# Patient Record
Sex: Female | Born: 1990 | Race: White | Hispanic: No | Marital: Single | Smoking: Never smoker
Health system: Southern US, Community
[De-identification: ages and names within clinical notes are randomized; demographics above are authoritative.]

## PROBLEM LIST (undated history)

## (undated) DIAGNOSIS — M4800 Spinal stenosis, site unspecified: Secondary | ICD-10-CM

## (undated) DIAGNOSIS — E119 Type 2 diabetes mellitus without complications: Secondary | ICD-10-CM

## (undated) DIAGNOSIS — E063 Autoimmune thyroiditis: Secondary | ICD-10-CM

---

## 2012-05-06 ENCOUNTER — Encounter (HOSPITAL_COMMUNITY): Payer: Self-pay | Admitting: Nurse Practitioner

## 2012-05-06 ENCOUNTER — Emergency Department (HOSPITAL_COMMUNITY)
Admission: EM | Admit: 2012-05-06 | Discharge: 2012-05-06 | Disposition: A | Discharge status: Home / Self Care | Payer: Federal, State, Local not specified - PPO | Attending: Emergency Medicine | Admitting: Emergency Medicine

## 2012-05-06 DIAGNOSIS — E1169 Type 2 diabetes mellitus with other specified complication: Secondary | ICD-10-CM | POA: Insufficient documentation

## 2012-05-06 DIAGNOSIS — R197 Diarrhea, unspecified: Secondary | ICD-10-CM | POA: Insufficient documentation

## 2012-05-06 DIAGNOSIS — R112 Nausea with vomiting, unspecified: Secondary | ICD-10-CM

## 2012-05-06 DIAGNOSIS — Z79899 Other long term (current) drug therapy: Secondary | ICD-10-CM | POA: Insufficient documentation

## 2012-05-06 DIAGNOSIS — R739 Hyperglycemia, unspecified: Secondary | ICD-10-CM

## 2012-05-06 DIAGNOSIS — E669 Obesity, unspecified: Secondary | ICD-10-CM | POA: Insufficient documentation

## 2012-05-06 HISTORY — DX: Type 2 diabetes mellitus without complications: E11.9

## 2012-05-06 LAB — CBC WITH DIFFERENTIAL/PLATELET
Basophils Absolute: 0 10*3/uL (ref 0.0–0.1)
Basophils Relative: 0 % (ref 0–1)
Eosinophils Absolute: 0 10*3/uL (ref 0.0–0.7)
Eosinophils Relative: 0 % (ref 0–5)
HCT: 42.8 % (ref 36.0–46.0)
Hemoglobin: 15.2 g/dL — ABNORMAL HIGH (ref 12.0–15.0)
Lymphocytes Relative: 5 % — ABNORMAL LOW (ref 12–46)
Lymphs Abs: 0.9 10*3/uL (ref 0.7–4.0)
MCH: 28.6 pg (ref 26.0–34.0)
MCHC: 35.5 g/dL (ref 30.0–36.0)
MCV: 80.6 fL (ref 78.0–100.0)
Monocytes Absolute: 0.5 10*3/uL (ref 0.1–1.0)
Monocytes Relative: 3 % (ref 3–12)
Neutro Abs: 14.9 10*3/uL — ABNORMAL HIGH (ref 1.7–7.7)
Neutrophils Relative %: 92 % — ABNORMAL HIGH (ref 43–77)
Platelets: 310 10*3/uL (ref 150–400)
RBC: 5.31 MIL/uL — ABNORMAL HIGH (ref 3.87–5.11)
RDW: 13.5 % (ref 11.5–15.5)
WBC: 16.2 10*3/uL — ABNORMAL HIGH (ref 4.0–10.5)

## 2012-05-06 LAB — URINALYSIS, MICROSCOPIC ONLY
Bilirubin Urine: NEGATIVE
Glucose, UA: >1000 mg/dL — AB
Hgb urine dipstick: NEGATIVE
Ketones, ur: >80 mg/dL — AB
Leukocytes, UA: NEGATIVE
Nitrite: NEGATIVE
Protein, ur: >300 mg/dL — AB
Specific Gravity, Urine: 1.041 — ABNORMAL HIGH (ref 1.005–1.030)
Urobilinogen, UA: 0.2 mg/dL (ref 0.0–1.0)
pH: 5 (ref 5.0–8.0)

## 2012-05-06 LAB — COMPREHENSIVE METABOLIC PANEL
ALT: 17 U/L (ref 0–35)
AST: 15 U/L (ref 0–37)
Albumin: 4.2 g/dL (ref 3.5–5.2)
Alkaline Phosphatase: 78 U/L (ref 39–117)
BUN: 15 mg/dL (ref 6–23)
CO2: 22 mEq/L (ref 19–32)
Calcium: 9.5 mg/dL (ref 8.4–10.5)
Chloride: 96 mEq/L (ref 96–112)
Creatinine, Ser: 0.56 mg/dL (ref 0.50–1.10)
GFR calc Af Amer: >90 mL/min (ref >90)
GFR calc non Af Amer: >90 mL/min (ref >90)
Glucose, Bld: 289 mg/dL — ABNORMAL HIGH (ref 70–99)
Potassium: 4.1 mEq/L (ref 3.5–5.1)
Sodium: 133 mEq/L — ABNORMAL LOW (ref 135–145)
Total Bilirubin: 0.4 mg/dL (ref 0.3–1.2)
Total Protein: 8 g/dL (ref 6.0–8.3)

## 2012-05-06 LAB — POCT PREGNANCY, URINE: Preg Test, Ur: NEGATIVE

## 2012-05-06 LAB — GLUCOSE, CAPILLARY
Glucose-Capillary: 275 mg/dL — ABNORMAL HIGH (ref 70–99)
Glucose-Capillary: 159 mg/dL — ABNORMAL HIGH (ref 70–99)

## 2012-05-06 LAB — LIPASE, BLOOD: Lipase: 38 U/L (ref 11–59)

## 2012-05-06 MED ORDER — HYDROMORPHONE HCL PF 1 MG/ML IJ SOLN
1.0000 mg | Freq: Once | INTRAMUSCULAR | Status: AC
  Administered 2012-05-06: 1 mg via INTRAVENOUS
  Filled 2012-05-06: qty 1

## 2012-05-06 MED ORDER — INSULIN ASPART 100 UNIT/ML ~~LOC~~ SOLN
10.0000 [IU] | Freq: Once | SUBCUTANEOUS | Status: AC
  Administered 2012-05-06: 10 [IU] via INTRAVENOUS
  Filled 2012-05-06: qty 1

## 2012-05-06 MED ORDER — ONDANSETRON HCL 4 MG PO TABS: 4.0000 mg | ORAL_TABLET | Freq: Four times a day (QID) | ORAL | Status: DC

## 2012-05-06 MED ORDER — ONDANSETRON HCL 4 MG/2ML IJ SOLN
4.0000 mg | Freq: Once | INTRAMUSCULAR | Status: AC
  Administered 2012-05-06: 4 mg via INTRAVENOUS
  Filled 2012-05-06: qty 2

## 2012-05-06 MED ORDER — SODIUM CHLORIDE 0.9 % IV BOLUS (SEPSIS)
1000.0000 mL | Freq: Once | INTRAVENOUS | Status: AC
  Administered 2012-05-06: 1000 mL via INTRAVENOUS

## 2012-05-06 NOTE — ED Provider Notes (Signed)
History    22 year old female with nausea, vomiting and diarrhea. Patient awoke from sleep this morning with her symptoms. Is complaining some epigastric pain and pain in her lower back. This is crampy in nature and relatively constant. She cannot remember if the pain or the vomiting and diarrhea started first. No urinary complaints. No unusual vaginal bleeding or discharge. She does not believe she is pregnant. No sick contacts. Patient has a history of diabetes. She reports compliance with her medications. She does not routinely check her blood sugars.  CSN: [id number]  Arrival date & time 05/06/12  1316   First MD Initiated Contact with Patient 05/06/12 1322      Chief Complaint  Patient presents with  . Vomiting    (Consider location/radiation/quality/duration/timing/severity/associated sxs/prior treatment) HPI  Past Medical History  Diagnosis Date  . Diabetes mellitus without complication     No past surgical history on file.  No family history on file.  History  Substance Use Topics  . Smoking status: Never Smoker   . Smokeless tobacco: Not on file  . Alcohol Use: No    OB History    Grav Para Term Preterm Abortions TAB SAB Ect Mult Living                  Review of Systems  All systems reviewed and negative, other than as noted in HPI.   Allergies  Review of patient's allergies indicates no known allergies.  Home Medications  No current outpatient prescriptions on file.  BP 159/92  Pulse 110  Temp 98.1 F (36.7 C) (Oral)  Resp 18  SpO2 96%  Physical Exam  Nursing note and vitals reviewed. Constitutional: She appears well-developed and well-nourished. No distress.       Laying in bed. No acute distress. Obese.  HENT:  Head: Normocephalic and atraumatic.  Eyes: Conjunctivae normal are normal. Pupils are equal, round, and reactive to light. Right eye exhibits no discharge. Left eye exhibits no discharge.  Neck: Neck supple.  Cardiovascular:  Regular rhythm and normal heart sounds.  Exam reveals no gallop and no friction rub.   No murmur heard.      Tachycardic with a regular rhythm  Pulmonary/Chest: Effort normal and breath sounds normal. No respiratory distress.  Abdominal: Soft. She exhibits no distension and no mass. There is no tenderness. There is no guarding.  Musculoskeletal: She exhibits no edema and no tenderness.  Neurological: She is alert.  Skin: Skin is warm and dry. She is not diaphoretic.  Psychiatric: She has a normal mood and affect. Her behavior is normal. Thought content normal.    ED Course  Procedures (including critical care time)  Labs Reviewed  GLUCOSE, CAPILLARY - Abnormal; Notable for the following:    Glucose-Capillary 275 (*)     All other components within normal limits  CBC WITH DIFFERENTIAL  COMPREHENSIVE METABOLIC PANEL  URINALYSIS, MICROSCOPIC ONLY  LIPASE, BLOOD   No results found.   1. Nausea and vomiting   2. Diarrhea   3. Hyperglycemia       MDM  22 year old female with nausea, vomiting and diarrhea since this morning. Suspect viral illness. Patient has a benign abdominal exam. We'll check basic labs given diabetic and hyperglycemic. IV fluids, pain medicine and antiemetics. We'll continue to monitor and reassess.  Blood sugar around 300. Patient with ketonuria, but bicarbonate is normal and gap is 15. Will give small dose of insulin. Additional IV fluids. Will continue to reassess.  Glucose improved as  well and symptoms. Still suspect viral illness. Plan continued symptomatic tx at this time. Return precautions discussed.       Raeford Razor, MD 05/07/12 1047

## 2012-05-06 NOTE — ED Notes (Addendum)
C/o n/v/d since waking this am. Tried to drink ginger ale but threw it up. Pt is type 2 diabetic and didn't check her blood sugar this am

## 2014-06-09 ENCOUNTER — Encounter (HOSPITAL_COMMUNITY): Payer: Self-pay | Admitting: Emergency Medicine

## 2014-06-09 ENCOUNTER — Emergency Department (HOSPITAL_COMMUNITY)
Admission: EM | Admit: 2014-06-09 | Discharge: 2014-06-09 | Disposition: A | Discharge status: Home / Self Care | Payer: BC Managed Care – PPO | Attending: Emergency Medicine | Admitting: Emergency Medicine

## 2014-06-09 ENCOUNTER — Emergency Department (HOSPITAL_COMMUNITY): Payer: BC Managed Care – PPO

## 2014-06-09 DIAGNOSIS — R0789 Other chest pain: Secondary | ICD-10-CM | POA: Insufficient documentation

## 2014-06-09 DIAGNOSIS — R000 Tachycardia, unspecified: Secondary | ICD-10-CM | POA: Diagnosis not present

## 2014-06-09 DIAGNOSIS — E119 Type 2 diabetes mellitus without complications: Secondary | ICD-10-CM | POA: Diagnosis not present

## 2014-06-09 DIAGNOSIS — Z79899 Other long term (current) drug therapy: Secondary | ICD-10-CM | POA: Insufficient documentation

## 2014-06-09 DIAGNOSIS — R079 Chest pain, unspecified: Secondary | ICD-10-CM | POA: Diagnosis present

## 2014-06-09 DIAGNOSIS — E039 Hypothyroidism, unspecified: Secondary | ICD-10-CM | POA: Diagnosis not present

## 2014-06-09 LAB — BASIC METABOLIC PANEL
Anion gap: 8 (ref 5–15)
BUN: 9 mg/dL (ref 6–23)
CO2: 23 mmol/L (ref 19–32)
Calcium: 9.4 mg/dL (ref 8.4–10.5)
Chloride: 102 mmol/L (ref 96–112)
Creatinine, Ser: 0.69 mg/dL (ref 0.50–1.10)
GFR calc Af Amer: >90 mL/min (ref >90)
GFR calc non Af Amer: >90 mL/min (ref >90)
GLUCOSE: 170 mg/dL — AB (ref 70–99)
POTASSIUM: 4.3 mmol/L (ref 3.5–5.1)
SODIUM: 133 mmol/L — AB (ref 135–145)

## 2014-06-09 LAB — CBC
HCT: 40 % (ref 36.0–46.0)
HEMOGLOBIN: 13.5 g/dL (ref 12.0–15.0)
MCH: 26.6 pg (ref 26.0–34.0)
MCHC: 33.8 g/dL (ref 30.0–36.0)
MCV: 78.7 fL (ref 78.0–100.0)
Platelets: 334 10*3/uL (ref 150–400)
RBC: 5.08 MIL/uL (ref 3.87–5.11)
RDW: 14 % (ref 11.5–15.5)
WBC: 7.8 10*3/uL (ref 4.0–10.5)

## 2014-06-09 LAB — D-DIMER, QUANTITATIVE: D-Dimer, Quant: 0.28 ug/mL-FEU (ref 0.00–0.48)

## 2014-06-09 LAB — I-STAT TROPONIN, ED: Troponin i, poc: 0 ng/mL (ref 0.00–0.08)

## 2014-06-09 MED ORDER — LEVOTHYROXINE SODIUM 50 MCG PO TABS: 100.0000 ug | ORAL_TABLET | Freq: Every day | ORAL | Status: DC

## 2014-06-09 NOTE — ED Notes (Signed)
Pt c/o intermittent chest pain and shortness of breath for about 3 weeks. Pt has family history of heart problems.

## 2014-06-09 NOTE — ED Notes (Signed)
PA Tori at bedside. 

## 2014-06-09 NOTE — ED Provider Notes (Signed)
Pt is a 24 y/o female with DM and low thyroid - off thyroid meds for last 2 weeks - since has been having intermittent CP which is not exertional - has had some SOB today with the pain - is sx free at this time.  No RF for PE other than mother with some clots in arm.  No other ACS rf.     EKG Interpretation  Date/Time:  Sunday June 09 2014 13:18:43 EST Ventricular Rate:  111 PR Interval:  134 QRS Duration: 82 QT Interval:  328 QTC Calculation: 446 R Axis:   51 Text Interpretation:  Sinus tachycardia Otherwise normal ECG No old tracing to compare Confirmed by Meeka Cartelli  MD, Lakenya Riendeau ([id number]) on 06/09/2014 4:39:37 PM      Pt needs d-dimer - otherwise, low risk ACS - refill synthroid.  Medical screening examination/treatment/procedure(s) were conducted as a shared visit with non-physician practitioner(s) and myself.  I personally evaluated the patient during the encounter.  Clinical Impression:   Final diagnoses:  Chest pain, unspecified chest pain type  Hypothyroidism, unspecified hypothyroidism type         Vida RollerBrian D Praveen Coia, MD 06/10/14 2037

## 2014-06-09 NOTE — ED Notes (Signed)
Declined W/C at D/C and was escorted to lobby by RN. 

## 2014-06-09 NOTE — ED Provider Notes (Signed)
CSN: [id number]     Arrival date & time 06/09/14  1312 History   First MD Initiated Contact with Patient 06/09/14 1617     Chief Complaint  Patient presents with  . Chest Pain  . Shortness of Breath     (Consider location/radiation/quality/duration/timing/severity/associated sxs/prior Treatment) HPI  Terri Mercer is a 24 y.o. female with PMH of DM, hypothyroidism presenting with 3 weeks of intermittent mid chest pressure the last for 15 minutes at rest as well as activity. Patient also reports associated shortness of breath. No nausea vomiting or diaphoresis. Patient states when she exercises she is not have the pain. A shunt without history of hypertension or dyslipidemia but does have history of diabetes. Patient reports no smoking history but does have family history of MI under age 67 and mother. Pt denies history of DVT, PE, recent surgery or trauma, malignancy, hemoptysis, unilateral leg swelling or tenderness, immobilization. Pt with hypothyroidism and has not been taking her synthroid.    Past Medical History  Diagnosis Date  . Diabetes mellitus without complication    History reviewed. No pertinent past surgical history. No family history on file. History  Substance Use Topics  . Smoking status: Never Smoker   . Smokeless tobacco: Not on file  . Alcohol Use: No   OB History    No data available     Review of Systems 10 Systems reviewed and are negative for acute change except as noted in the HPI.    Allergies  Bee venom and Lactose intolerance (gi)  Home Medications   Prior to Admission medications   Medication Sig Start Date End Date Taking? Authorizing Provider  glimepiride (AMARYL) 1 MG tablet Take 1 mg by mouth daily with breakfast.   Yes Historical Provider, MD  levothyroxine (SYNTHROID, LEVOTHROID) 100 MCG tablet Take 100 mcg by mouth daily before breakfast.   Yes Historical Provider, MD  Liraglutide 18 MG/3ML SOPN Inject 1.8 mg into the skin daily.  Victoza   Yes Historical Provider, MD  metFORMIN (GLUCOPHAGE) 1000 MG tablet Take 1,000 mg by mouth 2 (two) times daily with a meal.   Yes Historical Provider, MD  levothyroxine (SYNTHROID, LEVOTHROID) 50 MCG tablet Take 2 tablets (100 mcg total) by mouth daily before breakfast. 06/09/14   Louann Sjogren, PA-C  ondansetron (ZOFRAN) 4 MG tablet Take 1 tablet (4 mg total) by mouth every 6 (six) hours. Patient not taking: Reported on 06/09/2014 05/06/12   Raeford Razor, MD   BP 165/103 mmHg  Pulse 104  Temp(Src) 98.3 F (36.8 C)  Resp 18  Ht  (1.676 m)  Wt 208 lb (94.348 kg)  BMI 33.59 kg/m2  SpO2 100%  LMP 05/13/2014 Physical Exam  Constitutional: She appears well-developed and well-nourished. No distress.  HENT:  Head: Normocephalic and atraumatic.  Eyes: Conjunctivae and EOM are normal. Right eye exhibits no discharge. Left eye exhibits no discharge.  Neck: No JVD present.  Cardiovascular: Normal rate and regular rhythm.   Tachycardia  Pulmonary/Chest: Effort normal and breath sounds normal. No respiratory distress. She has no wheezes.  Abdominal: Soft. Bowel sounds are normal. She exhibits no distension. There is no tenderness.  Neurological: She is alert. She exhibits normal muscle tone. Coordination normal.  Skin: Skin is warm and dry. She is not diaphoretic.  Nursing note and vitals reviewed.   ED Course  Procedures (including critical care time) Labs Review Labs Reviewed  BASIC METABOLIC PANEL - Abnormal; Notable for the following:  Sodium 133 (*)    Glucose, Bld 170 (*)    All other components within normal limits  CBC  D-DIMER, QUANTITATIVE  TSH  I-STAT TROPOININ, ED    Imaging Review Dg Chest 2 View (if Patient Has Fever And/or Copd)  06/09/2014   CLINICAL DATA:  Chest pain, shortness of breath  EXAM: CHEST  2 VIEW  COMPARISON:  None.  FINDINGS: Lungs are clear.  No pleural effusion or pneumothorax.  The heart is normal in size.  Visualized osseous  structures are within normal limits.  IMPRESSION: Normal chest radiographs.   Electronically Signed   By: Charline BillsSriyesh  Krishnan M.D.   On: 06/09/2014 14:48     EKG Interpretation   Date/Time:  Sunday June 09 2014 13:18:43 EST Ventricular Rate:  111 PR Interval:  134 QRS Duration: 82 QT Interval:  328 QTC Calculation: 446 R Axis:   51 Text Interpretation:  Sinus tachycardia Otherwise normal ECG No old  tracing to compare Confirmed by MILLER  MD, BRIAN ([id number]) on 06/09/2014  4:39:37 PM      MDM   Final diagnoses:  Chest pain, unspecified chest pain type  Hypothyroidism, unspecified hypothyroidism type   Patient presenting with atypical intermittent chest pain at rest and with activity for the last 3 weeks. Patient's cardiac risk factors include diabetes, family history. Patient presenting with initial tachycardia but no hypoxia or tachypnea. Patient without peripheral edema or JVD or calf tenderness. Regular rate and rhythm and lungs clear to auscultation bilaterally. EKG with tachycardia no evidence of ischemia. Troponin negative and chest x-ray without acute abnormalities. Patient without anemia. I doubt ACS. Patient low risk for PE but with persistent tachycardia. Will evaluate with d-dimer. Which is negative. I doubt PE. Pt also not taking her thyroid medication, TSH pending. Synthroid refilled. Pt to follow up PCP.   Discussed return precautions with patient. Discussed all results and patient verbalizes understanding and agrees with plan.  Case has been discussed with Dr. Hyacinth MeekerMiller who agrees with the above plan and to discharge.      Louann SjogrenVictoria L Norval Slaven, PA-C 06/09/14 1800  Vida RollerBrian D Miller, MD 06/10/14 2037

## 2014-06-09 NOTE — Discharge Instructions (Signed)
Return to the emergency room with worsening of symptoms, new symptoms or with symptoms that are concerning , especially chest pain that feels like a pressure, spreads to left arm or jaw, worse with exertion, associated with nausea, vomiting, shortness of breath and/or sweating.  Please call your doctor for a followup appointment within 24-48 hours. When you talk to your doctor please let them know that you were seen in the emergency department and have them acquire all of your records so that they can discuss the findings with you and formulate a treatment plan to fully care for your new and ongoing problems. If you do not have a primary care provider please call the number below under ED resources to establish care with a provider and follow up.  Read below information and follow recommendations.   Chest Pain (Nonspecific) It is often hard to give a specific diagnosis for the cause of chest pain. There is always a chance that your pain could be related to something serious, such as a heart attack or a blood clot in the lungs. You need to follow up with your health care provider for further evaluation. CAUSES   Heartburn.  Pneumonia or bronchitis.  Anxiety or stress.  Inflammation around your heart (pericarditis) or lung (pleuritis or pleurisy).  A blood clot in the lung.  A collapsed lung (pneumothorax). It can develop suddenly on its own (spontaneous pneumothorax) or from trauma to the chest.  Shingles infection (herpes zoster virus). The chest wall is composed of bones, muscles, and cartilage. Any of these can be the source of the pain.  The bones can be bruised by injury.  The muscles or cartilage can be strained by coughing or overwork.  The cartilage can be affected by inflammation and become sore (costochondritis). DIAGNOSIS  Lab tests or other studies may be needed to find the cause of your pain. Your health care provider may have you take a test called an ambulatory  electrocardiogram (ECG). An ECG records your heartbeat patterns over a 24-hour period. You may also have other tests, such as:  Transthoracic echocardiogram (TTE). During echocardiography, sound waves are used to evaluate how blood flows through your heart.  Transesophageal echocardiogram (TEE).  Cardiac monitoring. This allows your health care provider to monitor your heart rate and rhythm in real time.  Holter monitor. This is a portable device that records your heartbeat and can help diagnose heart arrhythmias. It allows your health care provider to track your heart activity for several days, if needed.  Stress tests by exercise or by giving medicine that makes the heart beat faster. TREATMENT   Treatment depends on what may be causing your chest pain. Treatment may include:  Acid blockers for heartburn.  Anti-inflammatory medicine.  Pain medicine for inflammatory conditions.  Antibiotics if an infection is present.  You may be advised to change lifestyle habits. This includes stopping smoking and avoiding alcohol, caffeine, and chocolate.  You may be advised to keep your head raised (elevated) when sleeping. This reduces the chance of acid going backward from your stomach into your esophagus. Most of the time, nonspecific chest pain will improve within 2-3 days with rest and mild pain medicine.  HOME CARE INSTRUCTIONS   If antibiotics were prescribed, take them as directed. Finish them even if you start to feel better.  For the next few days, avoid physical activities that bring on chest pain. Continue physical activities as directed.  Do not use any tobacco products, including cigarettes, chewing tobacco,  or electronic cigarettes.  Avoid drinking alcohol.  Only take medicine as directed by your health care provider.  Follow your health care provider's suggestions for further testing if your chest pain does not go away.  Keep any follow-up appointments you made. If you do  not go to an appointment, you could develop lasting (chronic) problems with pain. If there is any problem keeping an appointment, call to reschedule. SEEK MEDICAL CARE IF:   Your chest pain does not go away, even after treatment.  You have a rash with blisters on your chest.  You have a fever. SEEK IMMEDIATE MEDICAL CARE IF:   You have increased chest pain or pain that spreads to your arm, neck, jaw, back, or abdomen.  You have shortness of breath.  You have an increasing cough, or you cough up blood.  You have severe back or abdominal pain.  You feel nauseous or vomit.  You have severe weakness.  You faint.  You have chills. This is an emergency. Do not wait to see if the pain will go away. Get medical help at once. Call your local emergency services (911 in U.S.). Do not drive yourself to the hospital. MAKE SURE YOU:   Understand these instructions.  Will watch your condition.  Will get help right away if you are not doing well or get worse. Document Released: 01/06/2005 Document Revised: 04/03/2013 Document Reviewed: 11/02/2007 [facility] Patient Information 2015 [place], [place]. This information is not intended to replace advice given to you by your health care provider. Make sure you discuss any questions you have with your health care provider.     Emergency Department Resource Guide 1) Find a Doctor and Pay Out of Pocket Although you won't have to find out who is covered by your insurance plan, it is a good idea to ask around and get recommendations. You will then need to call the office and see if the doctor you have chosen will accept you as a new patient and what types of options they offer for patients who are self-pay. Some doctors offer discounts or will set up payment plans for their patients who do not have insurance, but you will need to ask so you aren't surprised when you get to your appointment.  2) Contact Your Local Health Department Not all health  departments have doctors that can see patients for sick visits, but many do, so it is worth a call to see if yours does. If you don't know where your local health department is, you can check in your phone book. The CDC also has a tool to help you locate your state's health department, and many state websites also have listings of all of their local health departments.  3) Find a Walk-in Clinic If your illness is not likely to be very severe or complicated, you may want to try a walk in clinic. These are popping up all over the country in pharmacies, drugstores, and shopping centers. They're usually staffed by nurse practitioners or physician assistants that have been trained to treat common illnesses and complaints. They're usually fairly quick and inexpensive. However, if you have serious medical issues or chronic medical problems, these are probably not your best option.  No Primary Care Doctor: - Call Health Connect at  [phone] - they can help you locate a primary care doctor that  accepts your insurance, provides certain services, etc. - Physician Referral Service- [phone]  Chronic Pain Problems: Organization  Address  Phone   Notes  [facility]  [phone] Patients need to be referred by their primary care doctor.   Medication Assistance: Organization         Address  Phone   Notes  [facility] Medication [facility] [address] [place], [place] [address] [phone] --Must be a resident of [facility] -- Must have NO insurance coverage whatsoever (no Medicaid/ Medicare, etc.) -- The pt. MUST have a primary care doctor that directs their care regularly and follows them in the community   MedAssist  [phone]   [employer]  [phone]    Agencies that provide inexpensive medical care: Organization         Address  Phone   Notes  [place]  [phone]   [facility]     [phone]   [facility] [place], [place] [address] [phone]   [place] [address] [place] [phone]   Planned Parenthood    [phone]   [facility]    [phone]   [place] and [place] [address], [place] Phone:  [phone], Fax:  [phone] Hours of Operation:  9 am - 6 pm, M-F.  Also accepts Medicaid/Medicare and self-pay.  [facility] for [address] [place], [address], [place] Phone: [phone], Fax: [phone]. Hours of Operation:  8:30 am - 5:30 pm, M-F.  Also accepts Medicaid and self-pay.  [facility] [place] [address] [place] Phone: [phone]   [place], [place], [place] [phone], Ext. [phone] Mondays & Thursdays: 7-9 AM.  First 15 patients are seen on a first come, first serve basis.    [place] Providers:  Organization         Address  Phone   Notes  [facility] [address], [place] [phone] Also accepts self-pay patients.  [facility] [address], [place]  [phone]   [place], [address], [place] [phone]   [facility] [address], [place] [phone]   Lucianne Lei [address], [place]   [phone] Only accepts [place] Access [place] patients after they have their name applied to their card.   Self-Pay (no insurance) in [facility]:  Organization         Address  Phone   Notes  Sickle Cell Patients, [facility] Internal Medicine [place] [phone]   [facility] Urgent Care [place] [phone]   [facility] [place]  [place], [place], [facility] [phone]     [facility]/Dr. Osei-Bonsu  [address] [place] or [address], [place] [phone] Phone number for both [place] and [place] locations is the same.  Urgent Medical and [facility] [address] [place] [phone]   [facility] [address] [place] or [address] [phone]   [facility] [address] [place] [phone], phone; [phone], fax Sees patients 1st and 3rd Saturday of every month.  Must not  qualify for public or private insurance (i.e. Medicaid, Medicare, [place] Health Choice, Veterans' Benefits)  Household income should be no more than 200% of the poverty level The clinic cannot treat you if you are pregnant or think you are pregnant  Sexually transmitted diseases are not treated at the clinic.    Dental Care: Organization         Address  Phone  Notes  [facility] Department of [facility] [phone] Accepts children up to age 83 who are enrolled in [place] or [place]; pregnant women with a Medicaid card; and children who have applied for Medicaid or [place] Health Choice, but were declined, whose parents can pay a reduced fee at time of service.  [facility] Department of [facility]  [address], [place] [phone] Accepts children up to age 80 who are enrolled in [place] or [place]; pregnant women with a Medicaid card; and children who have applied for Medicaid or [place] Health Choice, but were declined, whose parents can pay a reduced fee at time of service.  [place] Adult Dental Access PROGRAM  [place] [phone] Patients are seen by appointment only. Walk-ins are not accepted. [place] will see patients 75 years of age and older. Monday - Tuesday (8am-5pm) Most Wednesdays (8:30-5pm) $30 per visit,  cash only  [facility] Adult Dental Access PROGRAM  [address], [facility] [phone] Patients are seen by appointment only. Walk-ins are not accepted. [place] will see patients 67 years of age and older. One Wednesday Evening (Monthly: Volunteer Based).  $30 per visit, cash only  [place]  [phone] for adults; Children under age 73, call Graduate Pediatric Dentistry at [phone]. Children aged 13-14, please call [phone] to request a pediatric application.  Dental services are provided in all areas of dental care including fillings, crowns and bridges, complete and partial dentures, implants, gum treatment, root canals, and extractions. Preventive care is also provided. Treatment is provided to both adults and children. Patients are selected via a lottery and there is often a waiting list.   [facility] [address], [place]  [phone] [email or web address]   [facility] [address] [place], [place] [phone], Ext. [phone] Second and Fourth Thursday of each month, opens at 6:30 AM; Clinic ends at 9 AM.  Patients are seen on a first-come first-served basis, and a limited number are seen during each clinic.   [facility]  [address] Hillard Danker [place], [place] [phone]   Eligibility Requirements You must have lived in [place], [place], or [place] counties for at least the last three months.   You cannot be eligible for state or federal sponsored [employer], including [employer], [place], or [employer].   You generally cannot be eligible for healthcare insurance through your employer.    How to apply: Eligibility screenings are held every Tuesday and Wednesday afternoon from 1:00 pm until 4:00 pm. You do not need an appointment for the interview!  [facility] [address], [place], [place]   [place]   [place] Department  [place]  [phone]    Behavioral Health Resources in the Community: Intensive Outpatient Programs Organization         Address  Phone  Notes  [place] [address] [place], [place] [phone]   [facility] Out[facility] [address], [place], [place]   ADS: Alcohol & Drug Svcs [address], [place], [place]   [place] [address],  [place], [place] or [phone]   Substance Abuse Resources Organization         Address  Phone  Notes  Alcohol and Drug Services  [phone]   [place]  [phone]   [facility] [place]   Chinita Pester  [phone]   Residential & Outpatient Substance Abuse Program  [phone]   Psychological Services Organization         Address  Phone  Notes  [facility]  [place]  [phone]   [place] [address] [place] or [phone]    Mobile Crisis Teams Organization         Address  Phone  Notes  [facility], Mobile Crisis Care Unit  [phone]   [facility] Psychotherapeutic Services  [address] [place], [place]   [place] [address], [place] [phone]    Self-Help/Support Groups Organization         Address  Phone             Notes  [place]. of [place] - variety of support groups  [place] Call for more information  Narcotics Anonymous (NA), [facility] [address], [employer] [place]  2 meetings at this location   Special educational needs teacher         Address  Phone  Notes  [facility] Residential Treatment [place],    [place]  [phone]   [facility]  [address], [place] [phone], [place], [place]    [place], [place] [phone] Admissions: 8am-3pm M-F  Incentives Substance [place] [address],    [place], [place] [phone]   [facility] [address] [place], [place], [place]   [facility] [address],  [place], [place]   [facility]ight Programs - Intensive Outpatient [address], [place], [place]   [facility] ([place].) [place].,  [place], [place] [phone] or [phone]   [facility]idential Treatment Services (RTS) [address], [place], [place] Accepts Medicaid  [facility] [place] [address],  [place] [phone] Substance Abuse/Addiction Treatment   [facility] Organization         Address  Phone  Notes  [facility]  [phone]   Domenic Schwab, PhD [address] Arlis [place], [place]   [phone] or [phone]   [facility] [place] [facility] [place], [place] [phone]   [place] [address], [place], [place] [phone] Insurance/Medicaid/sponsorship through [facility] and Families [address], [address] [place]                                    [place], [place] [phone] [place] [address], [place] [phone]    Dr. Adele Schilder  [phone]   [facility]  [place] Dept. 1) [address] [place] 2) [place] 3)  [place] [address], [place] [phone]  [phone]   [place] [phone] or [phone] (After Hours)

## 2015-07-01 ENCOUNTER — Encounter (HOSPITAL_BASED_OUTPATIENT_CLINIC_OR_DEPARTMENT_OTHER): Payer: Self-pay | Admitting: Emergency Medicine

## 2015-07-01 ENCOUNTER — Emergency Department (HOSPITAL_BASED_OUTPATIENT_CLINIC_OR_DEPARTMENT_OTHER)
Admission: EM | Admit: 2015-07-01 | Discharge: 2015-07-02 | Disposition: A | Discharge status: Home / Self Care | Payer: BC Managed Care – PPO | Attending: Emergency Medicine | Admitting: Emergency Medicine

## 2015-07-01 DIAGNOSIS — Z79899 Other long term (current) drug therapy: Secondary | ICD-10-CM | POA: Insufficient documentation

## 2015-07-01 DIAGNOSIS — M79661 Pain in right lower leg: Secondary | ICD-10-CM

## 2015-07-01 DIAGNOSIS — E109 Type 1 diabetes mellitus without complications: Secondary | ICD-10-CM | POA: Diagnosis not present

## 2015-07-01 DIAGNOSIS — Z7984 Long term (current) use of oral hypoglycemic drugs: Secondary | ICD-10-CM | POA: Diagnosis not present

## 2015-07-01 LAB — CBG MONITORING, ED: GLUCOSE-CAPILLARY: 110 mg/dL — AB (ref 65–99)

## 2015-07-01 NOTE — ED Notes (Addendum)
Patient states that for the last few month she has had increase in bilateral leg cramps. The patient reports that it has worsened over the last 2 -3 days. Reports that they usually come later in the evening.

## 2015-07-01 NOTE — ED Notes (Signed)
C/o leg cramps x months,  Worse past 2 weeks  Has appoint. With her md in am for same

## 2015-07-02 LAB — CBC WITH DIFFERENTIAL/PLATELET
BASOS ABS: 0 10*3/uL (ref 0.0–0.1)
BASOS PCT: 0 %
EOS ABS: 0.1 10*3/uL (ref 0.0–0.7)
Eosinophils Relative: 1 %
HEMATOCRIT: 37.9 % (ref 36.0–46.0)
HEMOGLOBIN: 12.5 g/dL (ref 12.0–15.0)
Lymphocytes Relative: 36 %
Lymphs Abs: 3.4 10*3/uL (ref 0.7–4.0)
MCH: 26.4 pg (ref 26.0–34.0)
MCHC: 33 g/dL (ref 30.0–36.0)
MCV: 80 fL (ref 78.0–100.0)
MONO ABS: 0.6 10*3/uL (ref 0.1–1.0)
MONOS PCT: 6 %
NEUTROS PCT: 57 %
Neutro Abs: 5.5 10*3/uL (ref 1.7–7.7)
Platelets: 347 10*3/uL (ref 150–400)
RBC: 4.74 MIL/uL (ref 3.87–5.11)
RDW: 14.4 % (ref 11.5–15.5)
WBC: 9.6 10*3/uL (ref 4.0–10.5)

## 2015-07-02 LAB — BASIC METABOLIC PANEL
Anion gap: 10 (ref 5–15)
BUN: 11 mg/dL (ref 6–20)
CALCIUM: 9.4 mg/dL (ref 8.9–10.3)
CO2: 24 mmol/L (ref 22–32)
CREATININE: 0.65 mg/dL (ref 0.44–1.00)
Chloride: 102 mmol/L (ref 101–111)
GFR calc Af Amer: >60 mL/min (ref >60)
Glucose, Bld: 119 mg/dL — ABNORMAL HIGH (ref 65–99)
Potassium: 3.8 mmol/L (ref 3.5–5.1)
Sodium: 136 mmol/L (ref 135–145)

## 2015-07-02 MED ORDER — NAPROXEN 250 MG PO TABS
500.0000 mg | ORAL_TABLET | Freq: Once | ORAL | Status: AC
  Administered 2015-07-02: 500 mg via ORAL
  Filled 2015-07-02: qty 2

## 2015-07-02 MED ORDER — MELOXICAM 15 MG PO TABS
ORAL_TABLET | ORAL | Status: AC
Start: 2015-07-02 — End: ?

## 2015-07-02 NOTE — Discharge Instructions (Signed)

## 2015-07-02 NOTE — ED Provider Notes (Signed)
CSN: [id number]     Arrival date & time 07/01/15  1923 History   First MD Initiated Contact with Patient 07/02/15 0022     Chief Complaint  Patient presents with  . Leg Pain     (Consider location/radiation/quality/duration/timing/severity/associated sxs/prior Treatment) HPI  This is a 25 year old female with insulin-dependent diabetes. She is here with several months of pain in her right lower leg. The pain is located in her right shin and more prominently in her dorsal right foot. The pain is dull and is episodic. It is worse at night. It is worse with movement. It is acutely worsened in the past several days. She has not taken anything for it. She is not aware of any injury. There is some occasional associated edema.  Past Medical History  Diagnosis Date  . Diabetes mellitus without complication (HCC)    History reviewed. No pertinent past surgical history. History reviewed. No pertinent family history. Social History  Substance Use Topics  . Smoking status: Never Smoker   . Smokeless tobacco: None  . Alcohol Use: No   OB History    No data available     Review of Systems  All other systems reviewed and are negative.   Allergies  Bee venom and Lactose intolerance (gi)  Home Medications   Prior to Admission medications   Medication Sig Start Date End Date Taking? Authorizing Provider  Liraglutide (VICTOZA) 18 MG/3ML SOPN Inject into the skin.   Yes Historical Provider, MD  lisinopril (PRINIVIL,ZESTRIL) 2.5 MG tablet Take 2.5 mg by mouth daily.   Yes Historical Provider, MD  glimepiride (AMARYL) 1 MG tablet Take 1 mg by mouth daily with breakfast.    Historical Provider, MD  levothyroxine (SYNTHROID, LEVOTHROID) 100 MCG tablet Take 100 mcg by mouth daily before breakfast.    Historical Provider, MD  levothyroxine (SYNTHROID, LEVOTHROID) 50 MCG tablet Take 2 tablets (100 mcg total) by mouth daily before breakfast. 06/09/14   Oswaldo Conroy, PA-C  Liraglutide 18 MG/3ML  SOPN Inject 1.8 mg into the skin daily. Victoza    Historical Provider, MD  metFORMIN (GLUCOPHAGE) 1000 MG tablet Take 1,000 mg by mouth 2 (two) times daily with a meal.    Historical Provider, MD  ondansetron (ZOFRAN) 4 MG tablet Take 1 tablet (4 mg total) by mouth every 6 (six) hours. Patient not taking: Reported on 06/09/2014 05/06/12   Raeford Razor, MD   BP 131/108 mmHg  Pulse 94  Temp(Src) 98.8 F (37.1 C) (Oral)  Resp 18  Ht  (1.676 m)  Wt 184 lb (83.462 kg)  BMI 29.71 kg/m2  SpO2 100%  LMP 06/17/2015   Physical Exam  General: Well-developed, well-nourished female in no acute distress; appearance consistent with age of record HENT: normocephalic; atraumatic Eyes: pupils equal, round and reactive to light; extraocular muscles intact Neck: supple Heart: regular rate and rhythm Lungs: clear to auscultation bilaterally Abdomen: soft; nondistended; nontender; no masses or hepatosplenomegaly; bowel sounds present Extremities: No deformity; full range of motion; pulses normal; no tenderness or edema Neurologic: Awake, alert and oriented; motor function intact in all extremities and symmetric; no facial droop Skin: Warm and dry Psychiatric: Normal mood and affect    ED Course  Procedures (including critical care time)   MDM   Nursing notes and vitals signs, including pulse oximetry, reviewed.  Summary of this visit's results, reviewed by myself:  Labs:  Results for orders placed or performed during the hospital encounter of 07/01/15 (from the past 24 hour(s))  POC CBG, ED     Status: Abnormal   Collection Time: 07/01/15 10:54 PM  Result Value Ref Range   Glucose-Capillary 110 (H) 65 - 99 mg/dL  CBC with Differential/Platelet     Status: None   Collection Time: 07/02/15 12:03 AM  Result Value Ref Range   WBC 9.6 4.0 - 10.5 K/uL   RBC 4.74 3.87 - 5.11 MIL/uL   Hemoglobin 12.5 12.0 - 15.0 g/dL   HCT 16.137.9 09.636.0 - 04.546.0 %   MCV 80.0 78.0 - 100.0 fL   MCH 26.4 26.0 -  34.0 pg   MCHC 33.0 30.0 - 36.0 g/dL   RDW 40.914.4 81.111.5 - 91.415.5 %   Platelets 347 150 - 400 K/uL   Neutrophils Relative % 57 %   Neutro Abs 5.5 1.7 - 7.7 K/uL   Lymphocytes Relative 36 %   Lymphs Abs 3.4 0.7 - 4.0 K/uL   Monocytes Relative 6 %   Monocytes Absolute 0.6 0.1 - 1.0 K/uL   Eosinophils Relative 1 %   Eosinophils Absolute 0.1 0.0 - 0.7 K/uL   Basophils Relative 0 %   Basophils Absolute 0.0 0.0 - 0.1 K/uL   1:13 AM The patient's pain appears musculoskeletal in nature. It is not in the calf which is the usual location of nocturnal leg cramps. The patient has an appointment pending with her primary care physician. We will also refer to sports medicine. We'll start her on Mobic in the meantime.      Paula LibraJohn Romello Hoehn, MD 07/02/15 [phone]

## 2019-04-18 ENCOUNTER — Ambulatory Visit: Payer: Federal, State, Local not specified - PPO | Attending: Internal Medicine

## 2019-04-18 DIAGNOSIS — Z20822 Contact with and (suspected) exposure to covid-19: Secondary | ICD-10-CM

## 2019-04-19 LAB — NOVEL CORONAVIRUS, NAA: SARS-CoV-2, NAA: NOT DETECTED

## 2021-01-27 ENCOUNTER — Other Ambulatory Visit: Payer: Self-pay | Admitting: Nurse Practitioner

## 2021-01-27 ENCOUNTER — Other Ambulatory Visit: Payer: Self-pay

## 2021-01-27 DIAGNOSIS — R32 Unspecified urinary incontinence: Secondary | ICD-10-CM

## 2021-02-09 ENCOUNTER — Other Ambulatory Visit: Payer: Federal, State, Local not specified - PPO

## 2021-02-10 ENCOUNTER — Ambulatory Visit
Admission: RE | Admit: 2021-02-10 | Discharge: 2021-02-10 | Disposition: A | Discharge status: Home / Self Care | Payer: BC Managed Care – PPO | Source: Ambulatory Visit | Attending: Nurse Practitioner | Admitting: Nurse Practitioner

## 2021-02-10 DIAGNOSIS — R32 Unspecified urinary incontinence: Secondary | ICD-10-CM

## 2021-02-26 ENCOUNTER — Other Ambulatory Visit: Payer: Self-pay | Admitting: Nurse Practitioner

## 2021-02-26 DIAGNOSIS — R32 Unspecified urinary incontinence: Secondary | ICD-10-CM

## 2021-02-26 DIAGNOSIS — M5440 Lumbago with sciatica, unspecified side: Secondary | ICD-10-CM

## 2021-04-01 ENCOUNTER — Ambulatory Visit
Admission: RE | Admit: 2021-04-01 | Discharge: 2021-04-01 | Disposition: A | Discharge status: Home / Self Care | Payer: BC Managed Care – PPO | Source: Ambulatory Visit | Attending: Nurse Practitioner | Admitting: Nurse Practitioner

## 2021-04-01 DIAGNOSIS — R32 Unspecified urinary incontinence: Secondary | ICD-10-CM

## 2021-04-01 DIAGNOSIS — M5440 Lumbago with sciatica, unspecified side: Secondary | ICD-10-CM

## 2022-09-28 IMAGING — MR MR PELVIS W/O CM
5 series · 33 of 48 positions shown · non-contrast
Comparison: None.

CLINICAL DATA: Right gluteal pain, sciatica

EXAM:
MRI PELVIS WITHOUT CONTRAST
TECHNIQUE: Multiplanar multisequence MR imaging of the pelvis was performed. No
intravenous contrast was administered.

[Series 3: STIR · coronal · 4.0mm · 0.70mm/px · 8 of 31 slices shown]
[im 1/31]
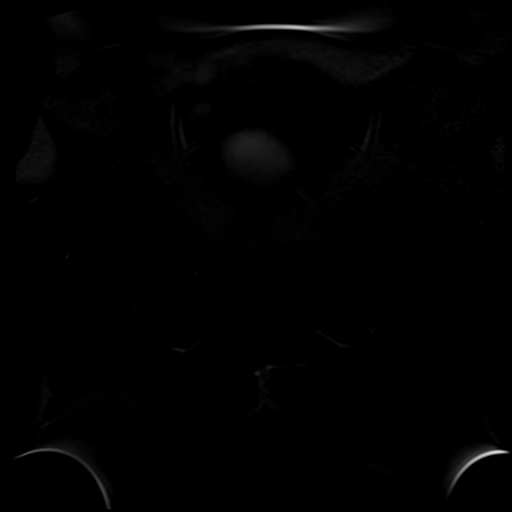
[im 5/31]
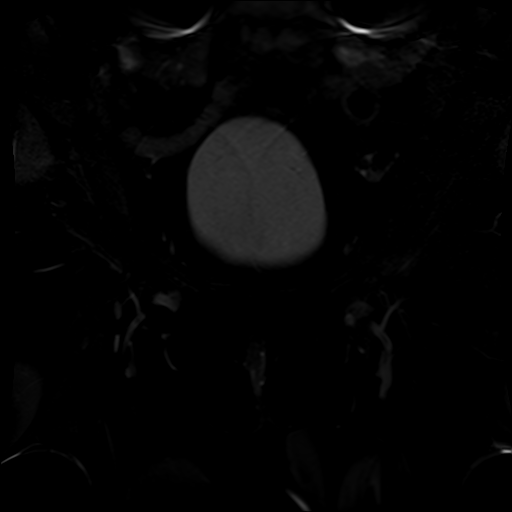
[im 9/31]
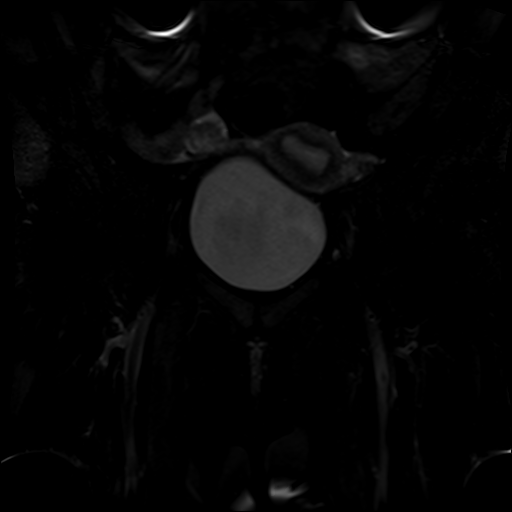
[im 13/31]
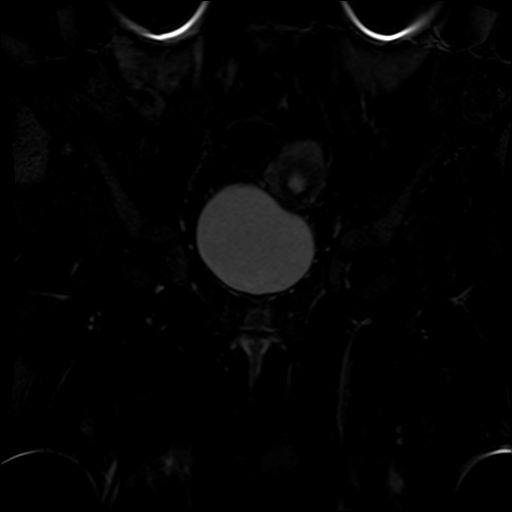
[im 18/31]
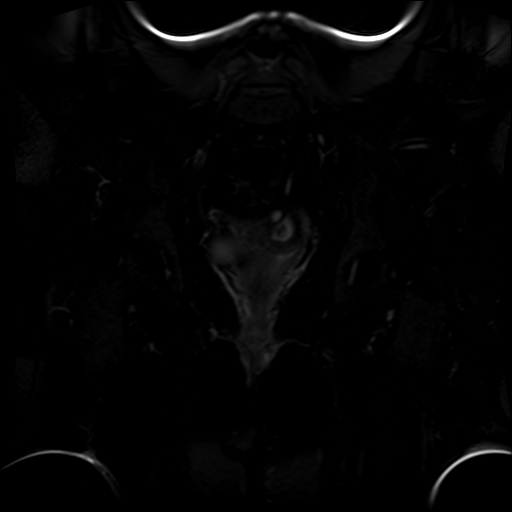
[im 22/31]
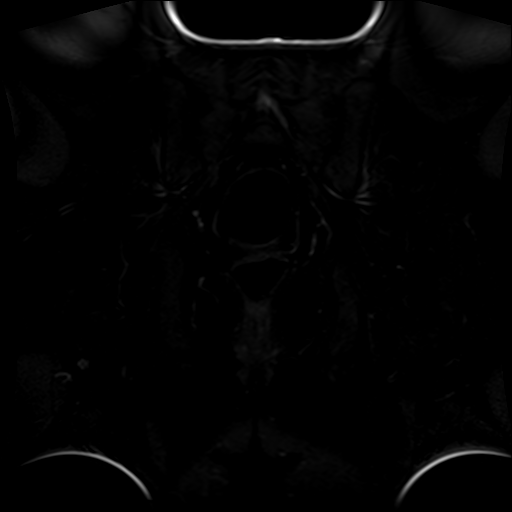
[im 26/31]
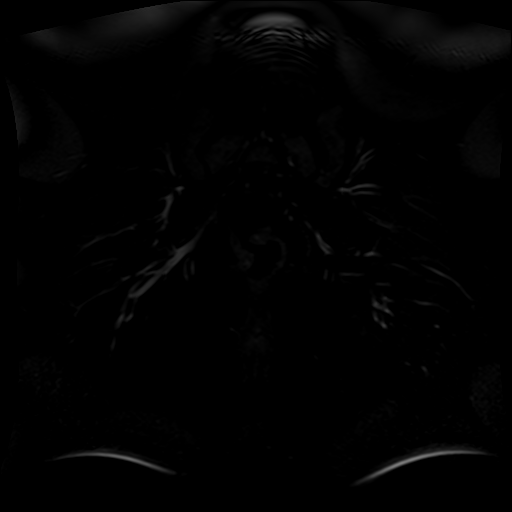
[im 31/31]
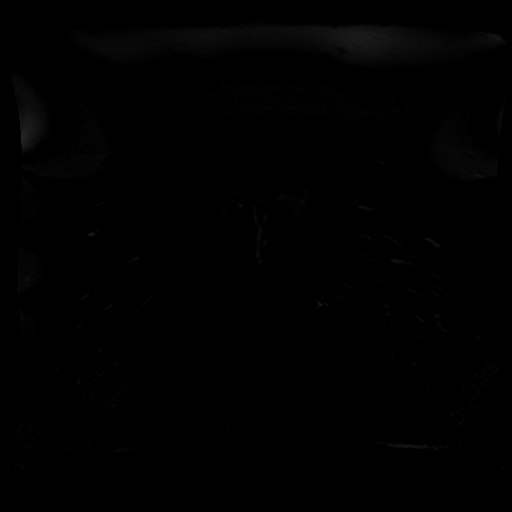

[Series 4: T1 · coronal · 4.0mm · 1.12mm/px · 7 of 31 slices shown (1 of 2)]
[im 1/31]
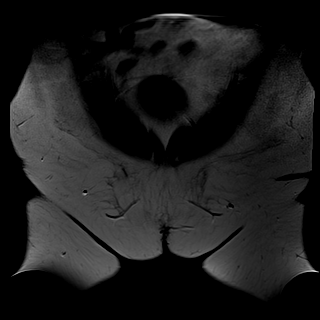
[im 6/31]
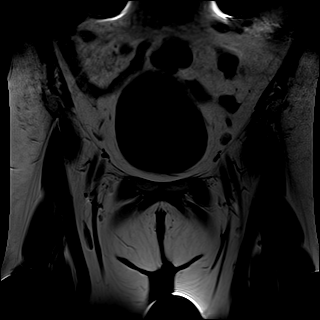
[im 11/31]
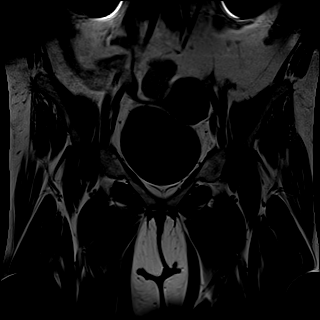
[im 16/31]
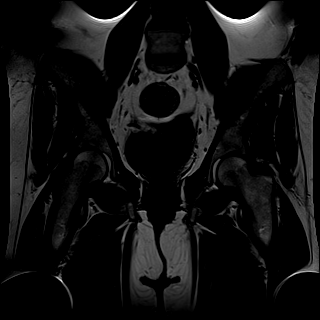
[im 21/31]
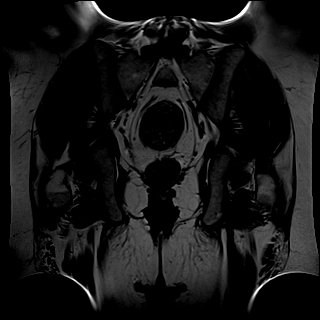
[im 26/31]
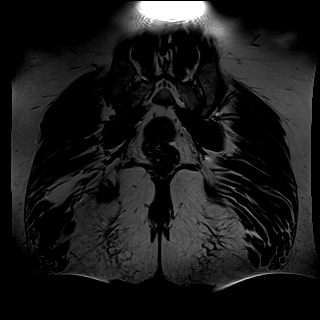
[im 31/31]
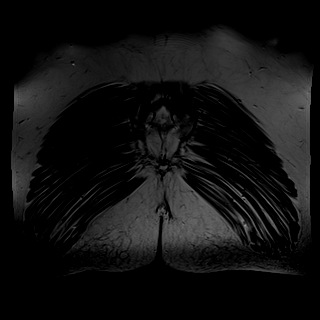

[Series 5: T1 · axial · 4.0mm · 1.00mm/px · z∈[-80,+152]mm · 8 of 44 slices shown (2 of 2)]
[im 1/44]
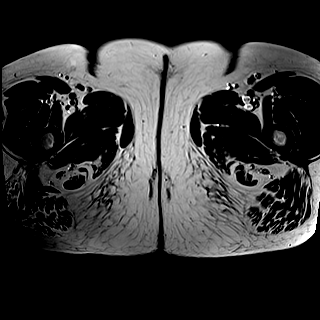
[im 5/44]
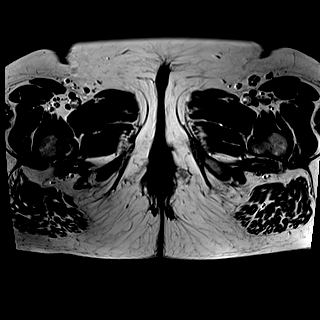
[im 15/44]
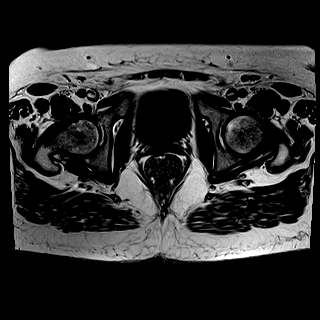
[im 20/44]
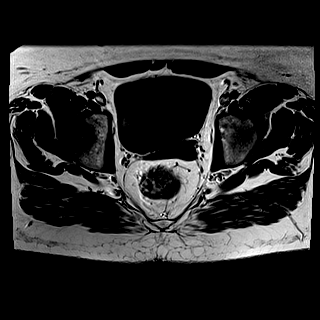
[im 24/44]
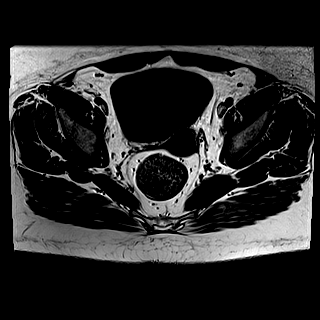
[im 29/44]
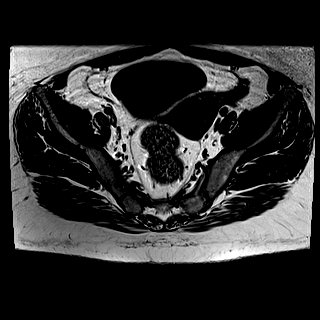
[im 39/44]
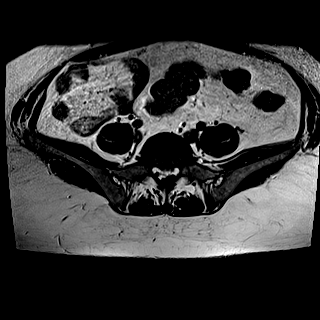
[im 44/44]
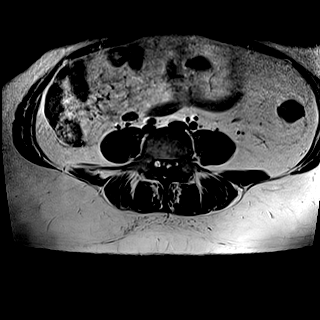

[Series 6: T2 fat-sat · axial · 4.0mm · 1.00mm/px · z∈[-80,+152]mm · 8 of 44 slices shown (1 of 2)]
[im 1/44]
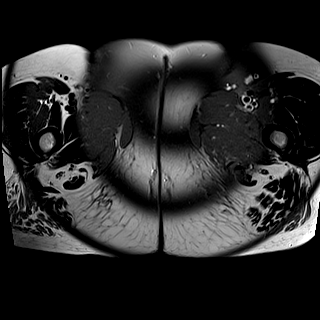
[im 5/44]
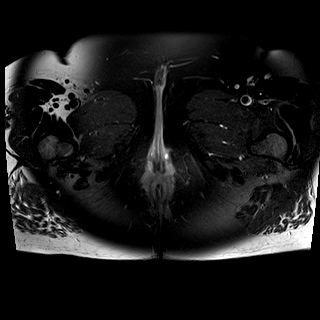
[im 15/44]
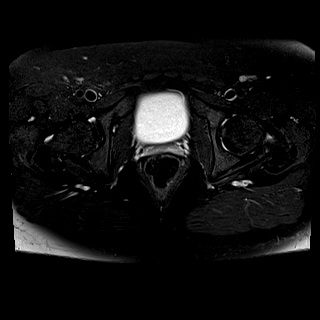
[im 20/44]
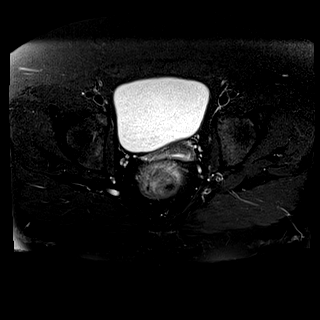
[im 24/44]
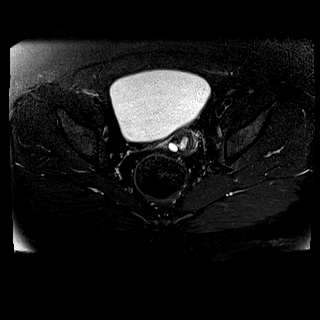
[im 29/44]
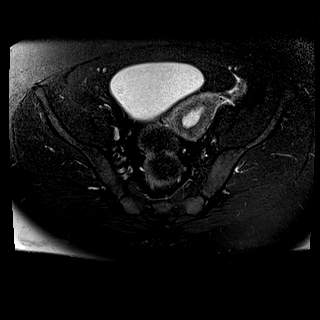
[im 39/44]
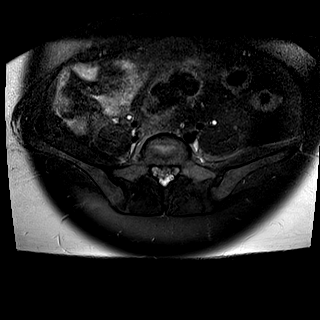
[im 44/44]
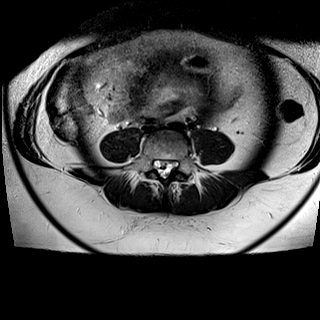

[Series 7: T2 fat-sat · sagittal · 4.0mm · 0.51mm/px · 2 of 53 slices shown (2 of 2)]
[im 1/53]
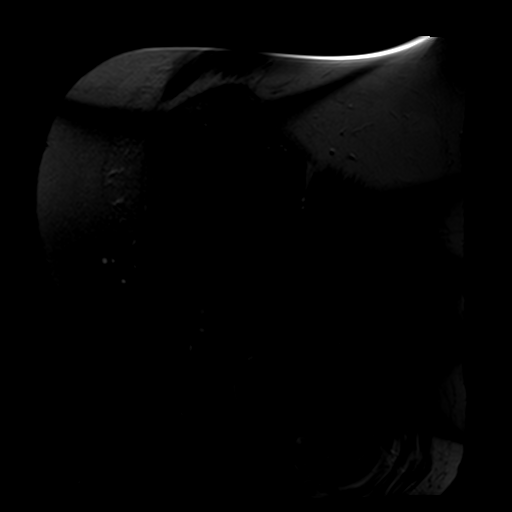
[im 9/53]
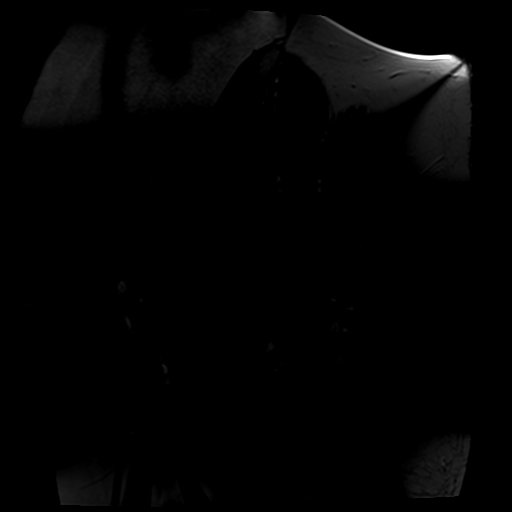

[33 of 48 positions shown; findings below may reference images not displayed]

FINDINGS: Urinary Tract:  Urinary bladder appears normal.

Bowel:  Visualized bowel is unremarkable.

Vascular/Lymphatic: No pathologically enlarged lymph nodes. No
significant vascular abnormality seen.

Reproductive: Small cervical nabothian cysts noted. No suspicious
adnexal mass.

Other:  None.

Musculoskeletal: No bone marrow edema or suspicious bony lesions.
Visualized musculature demonstrates normal bulk and signal,
including the right gluteus maximus muscle. No abnormal edema or
mass along the course of the sciatic nerves.
IMPRESSION: No significant abnormality identified.

## 2022-12-20 ENCOUNTER — Encounter: Payer: Self-pay | Admitting: Internal Medicine

## 2022-12-20 ENCOUNTER — Ambulatory Visit: Payer: BC Managed Care – PPO | Admitting: Internal Medicine

## 2022-12-20 ENCOUNTER — Other Ambulatory Visit: Payer: Self-pay | Admitting: Internal Medicine

## 2022-12-20 VITALS — BP 134/74 | HR 94 | Ht 66.0 in | Wt 189.0 lb

## 2022-12-20 DIAGNOSIS — E063 Autoimmune thyroiditis: Secondary | ICD-10-CM | POA: Diagnosis not present

## 2022-12-20 DIAGNOSIS — Z794 Long term (current) use of insulin: Secondary | ICD-10-CM | POA: Diagnosis not present

## 2022-12-20 DIAGNOSIS — E1165 Type 2 diabetes mellitus with hyperglycemia: Secondary | ICD-10-CM

## 2022-12-20 DIAGNOSIS — Z7984 Long term (current) use of oral hypoglycemic drugs: Secondary | ICD-10-CM | POA: Diagnosis not present

## 2022-12-20 LAB — POCT GLYCOSYLATED HEMOGLOBIN (HGB A1C): Hemoglobin A1C: 7.6 % — AB (ref 4.0–5.6)

## 2022-12-20 LAB — VITAMIN D 25 HYDROXY (VIT D DEFICIENCY, FRACTURES): VITD: 30.4 ng/mL (ref 30.00–100.00)

## 2022-12-20 LAB — MICROALBUMIN / CREATININE URINE RATIO
Creatinine,U: 47.9 mg/dL
Microalb Creat Ratio: 1.8 mg/g (ref 0.0–30.0)
Microalb, Ur: 0.9 mg/dL (ref 0.0–1.9)

## 2022-12-20 LAB — BASIC METABOLIC PANEL
BUN: 12 mg/dL (ref 6–23)
CO2: 21 meq/L (ref 19–32)
Calcium: 9.5 mg/dL (ref 8.4–10.5)
Chloride: 106 meq/L (ref 96–112)
Creatinine, Ser: 0.66 mg/dL (ref 0.40–1.20)
GFR: 114.62 mL/min (ref >60.00)
Glucose, Bld: 142 mg/dL — ABNORMAL HIGH (ref 70–99)
Potassium: 4.3 meq/L (ref 3.5–5.1)
Sodium: 136 meq/L (ref 135–145)

## 2022-12-20 LAB — TSH: TSH: 9.55 u[IU]/mL — ABNORMAL HIGH (ref 0.35–5.50)

## 2022-12-20 MED ORDER — SYNJARDY XR 25-1000 MG PO TB24: 1.0000 | ORAL_TABLET | Freq: Every day | ORAL | 2 refills | Status: DC

## 2022-12-20 MED ORDER — INSULIN PEN NEEDLE 32G X 4 MM MISC: 1.0000 | Freq: Every day | 3 refills | Status: DC

## 2022-12-20 MED ORDER — TRESIBA FLEXTOUCH 100 UNIT/ML ~~LOC~~ SOPN: 42.0000 [IU] | PEN_INJECTOR | Freq: Every day | SUBCUTANEOUS | 3 refills | Status: DC

## 2022-12-20 MED ORDER — RYBELSUS 14 MG PO TABS: 1.0000 | ORAL_TABLET | Freq: Every morning | ORAL | 3 refills | Status: DC

## 2022-12-20 NOTE — Telephone Encounter (Signed)
Terri Mercer is on the alternative list so why does it need to be changed

## 2022-12-20 NOTE — Patient Instructions (Addendum)
Decrease Synjardy  25-1000 mg, 1 tab  Every Morning   Continue Rybelsus 14 mg daily  Increase Tresiba 42 units daily      HOW TO TREAT LOW BLOOD SUGARS (Blood sugar LESS THAN 70 MG/DL) Please follow the RULE OF 15 for the treatment of hypoglycemia treatment (when your (blood sugars are less than 70 mg/dL)   STEP 1: Take 15 grams of carbohydrates when your blood sugar is low, which includes:  3-4 GLUCOSE TABS  OR 3-4 OZ OF JUICE OR REGULAR SODA OR ONE TUBE OF GLUCOSE GEL    STEP 2: RECHECK blood sugar in 15 MINUTES STEP 3: If your blood sugar is still low at the 15 minute recheck --> then, go back to STEP 1 and treat AGAIN with another 15 grams of carbohydrates.

## 2022-12-20 NOTE — Progress Notes (Unsigned)
Name: Terri Mercer  MRN/ DOB: [id number], 06-14-1990   Age/ Sex: 32 y.o., female    PCP: Dow Adolph, FNP   Reason for Endocrinology Evaluation: Type 2 Diabetes Mellitus/Hashimoto's thyroiditis     Date of Initial Endocrinology Visit: 12/20/2022     PATIENT IDENTIFIER: Terri Mercer is a 32 y.o. female with a past medical history of DM, Hashimoto's Thyroiditis. The patient presented for initial endocrinology clinic visit on 12/20/2022 for consultative assistance with her diabetes management.    HPI: Ms. Koffel was    Diagnosed with DM 2006 at age 32 Prior Medications tried/Intolerance: intolerant to Metformin - persistent hyperglycemia, victoza- nausea/vomiting  , glimepiride . Started on insulin prior to 2022 Currently checking blood sugars multiple x / day Hypoglycemia episodes : no           Hemoglobin A1c has ranged from 8.3% in 01/2022, peaking at 12.5% in 10/2021. Pt with hx of DKA 2022  In terms of diet, the patient eats    THYROID HISTORY She has been noted with elevated TPO Ab in 2023. She was diagnosed with hypothyroidism at age 32 in 2006   Has decreased sensation of the right foot  Had an episode at work with nausea and dizziness due to heat Denies constipation or diarrhea   Has occasional local neck swelling  Has hx of thyroid nodules but no FNA's  Has occasional palpitations   Mother's with Hashimoto's disease   HOME ENDOCRINE REGIMEN: Synjardy  25/1000 2 tabs daily  Rybelsus 14 mg daily  Tresiba 40 units daily  Levothyroxine 150 mcg daily      Statin: yes ACE-I/ARB:yes Prior Diabetic Education: yes   CGM :    DIABETIC COMPLICATIONS: Microvascular complications:   Denies: retinopathy, CKD , neuropathy  Last eye exam: Completed 12/2021  Macrovascular complications:   Denies: CAD, PVD, CVA   PAST HISTORY: Past Medical History:  Past Medical History:  Diagnosis Date   Diabetes mellitus without complication (HCC)     Past Surgical History: No past surgical history on file.  Social History:  reports that she has never smoked. She does not have any smokeless tobacco history on file. She reports that she does not drink alcohol and does not use drugs. Family History: No family history on file.   HOME MEDICATIONS: Allergies as of 12/20/2022       Reactions   Bee Venom Swelling   Localized swelling   Lactose Intolerance (gi) Diarrhea        Medication List        Accurate as of December 20, 2022  8:02 AM. If you have any questions, ask your nurse or doctor.          STOP taking these medications    glimepiride 1 MG tablet Commonly known as: AMARYL Stopped by: Johnney Ou Devon Kingdon   metFORMIN 1000 MG tablet Commonly known as: GLUCOPHAGE Stopped by: Johnney Ou Jung Ingerson   Victoza 18 MG/3ML Sopn Generic drug: liraglutide Stopped by: Johnney Ou Adelaine Roppolo       TAKE these medications    Dexcom G6 Sensor Misc by Does not apply route.   escitalopram 20 MG tablet Commonly known as: LEXAPRO Take 20 mg by mouth daily.   famotidine 40 MG tablet Commonly known as: PEPCID Take 40 mg by mouth daily.   fenofibrate micronized 67 MG capsule Commonly known as: LOFIBRA Take 134 mg by mouth daily.   ferrous sulfate 325 (65 FE) MG tablet Take 325 mg  by mouth 3 (three) times a week.   levothyroxine 150 MCG tablet Commonly known as: SYNTHROID Take 150 mcg by mouth every morning. What changed: Another medication with the same name was removed. Continue taking this medication, and follow the directions you see here. Changed by: Johnney Ou Davell Beckstead   lisinopril 2.5 MG tablet Commonly known as: ZESTRIL Take 2.5 mg by mouth daily.   meloxicam 15 MG tablet Commonly known as: Mobic Take 1/2 to 1 tablet at bedtime as needed for leg pain.   rosuvastatin 10 MG tablet Commonly known as: CRESTOR Take 10 mg by mouth at bedtime.   Rybelsus 14 MG Tabs Generic drug: Semaglutide Take 1  tablet by mouth every morning.   tiZANidine 4 MG tablet Commonly known as: ZANAFLEX Take 4 mg by mouth every 6 (six) hours as needed.   Evaristo Bury FlexTouch 100 UNIT/ML FlexTouch Pen Generic drug: insulin degludec Inject 40 Units into the skin daily.         ALLERGIES: Allergies  Allergen Reactions   Bee Venom Swelling    Localized swelling   Lactose Intolerance (Gi) Diarrhea     REVIEW OF SYSTEMS: A comprehensive ROS was conducted with the patient and is negative except as per HPI     OBJECTIVE:   VITAL SIGNS: BP 134/74 (BP Location: Left Arm, Patient Position: Sitting, Cuff Size: Large)   Pulse 94   Ht 5\' 6"  (1.676 m)   Wt 189 lb (85.7 kg)   SpO2 99%   BMI 30.51 kg/m    PHYSICAL EXAM:  General: Pt appears well and is in NAD  Neck: General: Supple without adenopathy or carotid bruits. Thyroid: Thyroid size normal.  No goiter or nodules appreciated.   Lungs: Clear with good BS bilat   Heart: RRR   Abdomen:  soft, nontender  Extremities:  Lower extremities - No pretibial edema.   Neuro: MS is good with appropriate affect, pt is alert and Ox3    DM foot exam: 12/20/2022  The skin of the feet is intact without sores or ulcerations. The pedal pulses are 2+ on right and 2+ on left. The sensation is intact to a screening 5.07, 10 gram monofilament bilaterally   DATA REVIEWED:  Lab Results  Component Value Date   HGBA1C 7.6 (A) 12/20/2022   Lab Results  Component Value Date   CREATININE 0.65 07/02/2015   No results found for: "MICRALBCREAT"  No results found for: "CHOL", "HDL", "LDLCALC", "LDLDIRECT", "TRIG", "CHOLHDL"      ASSESSMENT / PLAN / RECOMMENDATIONS:   1) Type 2 Diabetes Mellitus, Sub-Optimally controlled, Without complications - Most recent A1c of 7.6 %. Goal A1c < 7.0 %.     -Intolerant to Victoza and Ozempic due to GI side effects -Patient with history of DKA in 2022, she is on Synjardy, which I did explain to the patient increases her  risk of DKA.  Unfortunately she is taking 2 tablets of Synjardy 25-1000 mg, which puts her above the maximum dose of Jardiance -I have advised the patient to take 1 tablet of Synjardy from now on,  with the goal of discontinuing Jardiance -I will proceed with checking for auto antibodies to include GAD-65 and islet cell antibodies -We were unable to download her CGM today, but I was able to manually review the app, her BG's overnight barely trend down to the upper limit of normal, I will increase basal insulin as below -Patient has been noted with postprandial hyperglycemia, I have advised the patient  to avoid snacking as much as possible, we discussed options for low-carb snacks -We have opted to continue Rybelsus, will consider Mounjaro in the future   MEDICATIONS: Decrease Synjardy 25-1000 mg, 1 tablet every morning Continue Rybelsus 14 mg daily Increase Tresiba 42 units daily  EDUCATION / INSTRUCTIONS: BG monitoring instructions: Patient is instructed to check her blood sugars 3 times a day, before meals. Call [facility] if: BG persistently < 70  I reviewed the Rule of 15 for the treatment of hypoglycemia in detail with the patient. Literature supplied.   2) Diabetic complications:  Eye: Does not have known diabetic retinopathy.  Neuro/ Feet: Does not have known diabetic peripheral neuropathy. Renal: Patient does not have known baseline CKD. She is  on an ACEI/ARB at present.  3)Hashimoto's Thyroiditis :  -She has history of thyroid nodules, will proceed with thyroid ultrasound -- Pt educated extensively on the correct way to take levothyroxine (first thing in the morning with water, 30 minutes before eating or taking other medications). - Pt encouraged to double dose the following day if she were to miss a dose given long half-life of levothyroxine.  Medication Levothyroxine 150 mcg daily  Follow-up in 4 months   Signed electronically by: Lyndle Herrlich, MD  [facility] Endocrinology  [facility] [address] [place] [place], [place] [address] Phone: [phone] FAX: [phone]   CC: Dow Adolph, FNP [address] [place] [address] Phone: [phone]  Fax: [phone]    Return to Endocrinology clinic as below: No future appointments.

## 2022-12-21 ENCOUNTER — Encounter: Payer: Self-pay | Admitting: Internal Medicine

## 2022-12-21 MED ORDER — LEVOTHYROXINE SODIUM 150 MCG PO TABS: 150.0000 ug | ORAL_TABLET | ORAL | 2 refills | Status: DC

## 2022-12-26 LAB — ISLET CELL AB SCREEN RFLX TO TITER: ISLET CELL ANTIBODY SCREEN: NEGATIVE

## 2022-12-26 LAB — GLUTAMIC ACID DECARBOXYLASE AUTO ABS: Glutamic Acid Decarb Ab: 220 [IU]/mL — ABNORMAL HIGH (ref <5)

## 2022-12-27 ENCOUNTER — Telehealth: Payer: Self-pay | Admitting: Internal Medicine

## 2022-12-27 DIAGNOSIS — E1065 Type 1 diabetes mellitus with hyperglycemia: Secondary | ICD-10-CM | POA: Insufficient documentation

## 2022-12-27 MED ORDER — METFORMIN HCL ER 500 MG PO TB24: 500.0000 mg | ORAL_TABLET | Freq: Every day | ORAL | 3 refills | Status: DC

## 2022-12-27 MED ORDER — INSULIN PEN NEEDLE 32G X 4 MM MISC: 1.0000 | Freq: Four times a day (QID) | 3 refills | Status: DC

## 2022-12-27 MED ORDER — NOVOLOG FLEXPEN 100 UNIT/ML ~~LOC~~ SOPN: PEN_INJECTOR | SUBCUTANEOUS | 3 refills | Status: DC

## 2022-12-27 NOTE — Telephone Encounter (Signed)
Discussed GAD-65 Ab with a new Dx of T1DM   Glutamic Acid Decarb Ab <5 IU/mL 220 High     Recommendations  STOP Synjardy  Start Metformin 500 mg XR daily  Start Novolig per CS (Bg -130/35) TIDQAC    Abby Raelyn Mora, MD  [facility] Endocrinology  [facility] [address] [place], [place] [address] Phone: [phone] FAX: [phone]

## 2022-12-31 ENCOUNTER — Ambulatory Visit
Admission: RE | Admit: 2022-12-31 | Discharge: 2022-12-31 | Disposition: A | Discharge status: Home / Self Care | Payer: BC Managed Care – PPO | Source: Ambulatory Visit | Attending: Internal Medicine | Admitting: Internal Medicine

## 2022-12-31 DIAGNOSIS — E063 Autoimmune thyroiditis: Secondary | ICD-10-CM

## 2023-01-17 LAB — HM DIABETES EYE EXAM

## 2023-01-19 ENCOUNTER — Other Ambulatory Visit (HOSPITAL_COMMUNITY): Payer: Self-pay

## 2023-01-19 ENCOUNTER — Other Ambulatory Visit: Payer: Self-pay | Admitting: Internal Medicine

## 2023-01-19 ENCOUNTER — Telehealth: Payer: Self-pay

## 2023-01-19 NOTE — Telephone Encounter (Signed)
Pharmacy Patient Advocate Encounter   Received notification from CoverMyMeds that prior authorization for Rybelsus 14mg  is required/requested.   Per test claim: PA required; PA submitted to [facility] via CoverMyMeds Key/confirmation #/EOC [id number] Status is pending

## 2023-01-21 NOTE — Telephone Encounter (Signed)
Pharmacy Patient Advocate Encounter  Received notification from [facility] that Prior Authorization for Rybelsus 14mg  has been APPROVED through 01/18/2026

## 2023-04-27 ENCOUNTER — Encounter: Payer: Self-pay | Admitting: Internal Medicine

## 2023-04-27 ENCOUNTER — Ambulatory Visit: Payer: 59 | Admitting: Internal Medicine

## 2023-04-27 ENCOUNTER — Other Ambulatory Visit: Payer: Self-pay | Admitting: Internal Medicine

## 2023-04-27 VITALS — BP 124/72 | HR 94 | Ht 66.0 in | Wt 206.0 lb

## 2023-04-27 DIAGNOSIS — E1065 Type 1 diabetes mellitus with hyperglycemia: Secondary | ICD-10-CM | POA: Diagnosis not present

## 2023-04-27 DIAGNOSIS — E063 Autoimmune thyroiditis: Secondary | ICD-10-CM

## 2023-04-27 MED ORDER — DEXCOM G7 SENSOR MISC: 1.0000 | 3 refills | Status: DC

## 2023-04-27 MED ORDER — METFORMIN HCL ER 500 MG PO TB24: 1000.0000 mg | ORAL_TABLET | Freq: Every day | ORAL | 3 refills | Status: DC

## 2023-04-27 MED ORDER — NOVOLOG FLEXPEN 100 UNIT/ML ~~LOC~~ SOPN: PEN_INJECTOR | SUBCUTANEOUS | 3 refills | Status: DC

## 2023-04-27 MED ORDER — TRESIBA FLEXTOUCH 100 UNIT/ML ~~LOC~~ SOPN: 46.0000 [IU] | PEN_INJECTOR | Freq: Every day | SUBCUTANEOUS | 3 refills | Status: DC

## 2023-04-27 NOTE — Progress Notes (Signed)
 Name: NIOKA SETZLER  MRN/ DOB: [id number], February 23, 1991   Age/ Sex: 33 y.o., female    PCP: Nella Bame, FNP   Reason for Endocrinology Evaluation: Type 1 Diabetes Mellitus/Hashimoto's thyroiditis     Date of Initial Endocrinology Visit: 12/20/2022    PATIENT IDENTIFIER: Terri Mercer is a 33 y.o. female with a past medical history of DM, Hashimoto's Thyroiditis. The patient presented for initial endocrinology clinic visit on 12/20/2022 for consultative assistance with her diabetes management.    HPI: Ms. Blough was    Diagnosed with DM 2006 at age 15 Prior Medications tried/Intolerance: intolerant to Metformin  - persistent hyperglycemia, victoza- nausea/vomiting  , glimepiride . Started on insulin  prior to 2022     Hemoglobin A1c has ranged from 8.3% in 01/2022, peaking at 12.5% in 10/2021. Pt with hx of DKA 2022   On her initial visit to our clinic she had an A1c 7.6%, she was on Synjardy , Rybelsus , Tresiba .  We adjusted Tresiba    GAD-65 elevated at 220 we will change her diagnosis to T1 DM 12/2022  We discontinued Jardiance and just started her on plain metformin  at the time  THYROID  HISTORY She has been noted with elevated TPO Ab in 2023. She was diagnosed with hypothyroidism at age 32 in 73  Mother with Hashimoto's thyroiditis  Based on patient report of the presence of thyroid  nodules, thyroid  ultrasound was performed 12/2022 that did NOT reveal any nodules but rather mildly heterogeneous and enlarged thyroid  gland consistent with clinical history of Hashimoto's thyroiditis with no discrete nodules or significant hypervascularity.   SUBJECTIVE:   During the last visit (12/20/2022): A1c 7.6%    Today (04/27/23): Terri Mercer is here for follow-up on diabetes management and hypothyroid.  She checks her blood sugars multiple times daily. The patient has not had hypoglycemic episodes since the last clinic visit.   Denies recent nausea or vomiting   Has occasional changes in bowel movements Denies constipation or diarrhea  Denies tremors Continues with occasional palpitations    HOME ENDOCRINE REGIMEN: Metformin  500 mg XR, 1 tablet daily  Rybelsus  14 mg daily  Tresiba  42 units daily  Levothyroxine  150 mcg, 1.5 tab on Sundays and 1 tab rest of the week    Statin: yes ACE-I/ARB:yes Prior Diabetic Education: yes   CONTINUOUS GLUCOSE MONITORING RECORD INTERPRETATION    Dates of Recording: 1/2 - 04/27/2023  Sensor description: Dexcom  Results statistics:   CGM use % of time 93  Average and SD 270/56  Time in range      4%  % Time Above 180 38  % Time above 250 58  % Time Below target 0   Glycemic patterns summary: Hyperglycemia noted throughout the day and night  Hyperglycemic episodes all day and night  Hypoglycemic episodes occurred N/A  Overnight periods: High    DIABETIC COMPLICATIONS: Microvascular complications:   Denies: retinopathy, CKD , neuropathy  Last eye exam: Completed 12/2021  Macrovascular complications:   Denies: CAD, PVD, CVA   PAST HISTORY: Past Medical History:  Past Medical History:  Diagnosis Date   Diabetes mellitus without complication (HCC)    Past Surgical History: No past surgical history on file.  Social History:  reports that she has never smoked. She does not have any smokeless tobacco history on file. She reports that she does not drink alcohol and does not use drugs. Family History: No family history on file.   HOME MEDICATIONS: Allergies as of 04/27/2023  Reactions   Bee Venom Swelling   Localized swelling   Lactose Intolerance (gi) Diarrhea        Medication List        Accurate as of April 27, 2023 10:20 AM. If you have any questions, ask your nurse or doctor.          Dexcom G6 Sensor Misc by Does not apply route.   escitalopram 20 MG tablet Commonly known as: LEXAPRO Take 20 mg by mouth daily.   famotidine 40 MG tablet Commonly  known as: PEPCID Take 40 mg by mouth daily.   fenofibrate micronized 67 MG capsule Commonly known as: LOFIBRA Take 134 mg by mouth daily.   ferrous sulfate 325 (65 FE) MG tablet Take 325 mg by mouth 3 (three) times a week.   Insulin  Pen Needle 32G X 4 MM Misc 1 Device by Does not apply route in the morning, at noon, in the evening, and at bedtime.   levothyroxine  150 MCG tablet Commonly known as: SYNTHROID  Take 1 tablet (150 mcg total) by mouth as directed. 1.5 tabs on Sundays and 1 tab rest of the week   lisinopril 2.5 MG tablet Commonly known as: ZESTRIL Take 2.5 mg by mouth daily.   meloxicam 15 MG tablet Commonly known as: Mobic Take 1/2 to 1 tablet at bedtime as needed for leg pain.   metFORMIN 500 MG 24 hr tablet Commonly known as: GLUCOPHAGE-XR Take 1 tablet (500 mg total) by mouth daily with breakfast.   NovoLOG FlexPen 100 UNIT/ML FlexPen Generic drug: insulin aspart Max daily 30 units   rosuvastatin 10 MG tablet Commonly known as: CRESTOR Take 10 mg by mouth at bedtime.   Rybelsus 14 MG Tabs Generic drug: Semaglutide TAKE 1 TABLET (14 MG TOTAL) BY MOUTH EVERY MORNING.   tiZANidine 4 MG tablet Commonly known as: ZANAFLEX Take 4 mg by mouth every 6 (six) hours as needed.   Tresiba FlexTouch 100 UNIT/ML FlexTouch Pen Generic drug: insulin degludec Inject 42 Units into the skin daily.   Vitamin D (Ergocalciferol) 1.25 MG (50000 UNIT) Caps capsule Commonly known as: DRISDOL Take 50,000 Units by mouth once a week.         ALLERGIES: Allergies  Allergen Reactions   Bee Venom Swelling    Localized swelling   Lactose Intolerance (Gi) Diarrhea     REVIEW OF SYSTEMS: A comprehensive ROS was conducted with the patient and is negative except as per HPI     OBJECTIVE:   VITAL SIGNS: BP 124/72 (BP Location: Right Arm, Patient Position: Sitting, Cuff Size: Normal)   Pulse 94   Ht 5\' 6" (1.676 m)   Wt 206 lb (93.4 kg)   SpO2 98%   BMI 33.25 kg/m     PHYSICAL EXAM:  General: Pt appears well and is in NAD  Lungs: Clear with good BS bilat   Heart: RRR   Abdomen:  soft, nontender  Extremities:  Lower extremities - No pretibial edema.   Neuro: MS is good with appropriate affect, pt is alert and Ox3    DM foot exam: 12/20/2022  The skin of the feet is intact without sores or ulcerations. The pedal pulses are 2+ on right and 2+ on left. The sensation is intact to a screening 5.07, 10 gram monofilament bilaterally   DATA REVIEWED:  Lab Results  Component Value Date   HGBA1C 7.6 (A) 12/20/2022     12 /13/2024  BUN 9 CR 0.640 GFR 114 LDL 136 TG 175  HDL 47 TSH 2.890 A1c 8.4%      Latest Reference Range & Units 12/20/22 08:15  Sodium 135 - 145 mEq/L 136  Potassium 3.5 - 5.1 mEq/L 4.3  Chloride 96 - 112 mEq/L 106  CO2 19 - 32 mEq/L 21  Glucose 70 - 99 mg/dL 409 (H)  BUN 6 - 23 mg/dL 12  Creatinine 8.11 - 9.14 mg/dL 7.82  Calcium 8.4 - 95.6 mg/dL 9.5  GFR >21.30 mL/min 114.62  MICROALB/CREAT RATIO 0.0 - 30.0 mg/g 1.8  VITD 30.00 - 100.00 ng/mL 30.40     Latest Reference Range & Units 12/20/22 08:15  Creatinine,U mg/dL 86.5  Microalb, Ur 0.0 - 1.9 mg/dL 0.9  MICROALB/CREAT RATIO 0.0 - 30.0 mg/g 1.8    Latest Reference Range & Units 12/20/22 08:15  Glutamic Acid Decarb Ab <5 IU/mL 220 (H)  (H): Data is abnormally high   ASSESSMENT / PLAN / RECOMMENDATIONS:   1) Type 1 Diabetes Mellitus, Poorly Optimally controlled, Without complications - Most recent A1c of 8.4%. Goal A1c < 7.0 %.    -Patient with elevated GAD - 65 -Intolerant to Victoza and Ozempic due to GI side effects -Patient with history of DKA in 2022, SGLT2 inhibitors are contraindicated -We entertained the idea of switching Rybelsus  to Mounjaro but she has had severe GI side effects to injectables before and we opted to remain on Rybelsus  -We also entertain the idea of insulin  pump technology, but at this time her insurance is paying for everything  and we opted to remain on current regimen    MEDICATIONS: Increase Metformin  500 mg XR, 2 tabs daily Continue Rybelsus  14 mg daily Increase Tresiba  46 units daily Start Novolog  4 units TIDQAC Change CF: Novolog  (BG-130/30) TIDQAC  EDUCATION / INSTRUCTIONS: BG monitoring instructions: Patient is instructed to check her blood sugars 3 times a day, before meals. Call  Endocrinology clinic if: BG persistently < 70  I reviewed the Rule of 15 for the treatment of hypoglycemia in detail with the patient. Literature supplied.   2) Diabetic complications:  Eye: Does not have known diabetic retinopathy.  Neuro/ Feet: Does not have known diabetic peripheral neuropathy. Renal: Patient does not have known baseline CKD. She is  on an ACEI/ARB at present.  3)Hashimoto's Thyroiditis :  -Patient is clinically and biochemically euthyroid -TSH was normal through her PCPs office in December, 2024 -No change   Medication Continue levothyroxine  150 mcg, 1.5 tabs on Sundays and 1 tablet rest of the week  Follow-up in 4 months   Signed electronically by: Natale Bail, MD  [facility] Endocrinology  [facility] [address] [place] [place], [place] [address] Phone: [phone] FAX: [phone]   CC: Nella Bame, FNP [address] [place] [address] Phone: [phone]  Fax: [phone]    Return to Endocrinology clinic as below: No future appointments.

## 2023-04-27 NOTE — Patient Instructions (Signed)
 Increase Metformin  500mg  XR, TWO tabs daily  Continue Rybelsus  14 mg daily  Take Novolog  4 units before each meal  Increase Tresiba  46 units daily  Novolog  correctional insulin : ADD extra units on insulin  to your meal-time Novolog  dose if your blood sugars are higher than 160. Use the scale below to help guide you:   Blood sugar before meal Number of units to inject  Less than 160 0 unit  161 -  190 1 units  191 -  220 2 units  221 -  250 3 units  251 -  280 4 units  281 -  310 5 units  311 -  340 6 units  341 -  370 7 units        HOW TO TREAT LOW BLOOD SUGARS (Blood sugar LESS THAN 70 MG/DL) Please follow the RULE OF 15 for the treatment of hypoglycemia treatment (when your (blood sugars are less than 70 mg/dL)   STEP 1: Take 15 grams of carbohydrates when your blood sugar is low, which includes:  3-4 GLUCOSE TABS  OR 3-4 OZ OF JUICE OR REGULAR SODA OR ONE TUBE OF GLUCOSE GEL    STEP 2: RECHECK blood sugar in 15 MINUTES STEP 3: If your blood sugar is still low at the 15 minute recheck --> then, go back to STEP 1 and treat AGAIN with another 15 grams of carbohydrates.

## 2023-06-24 ENCOUNTER — Encounter: Payer: Self-pay | Admitting: Internal Medicine

## 2023-08-08 ENCOUNTER — Inpatient Hospital Stay

## 2023-08-08 ENCOUNTER — Encounter: Payer: Self-pay | Admitting: Hematology and Oncology

## 2023-08-08 ENCOUNTER — Inpatient Hospital Stay: Attending: Hematology and Oncology | Admitting: Hematology and Oncology

## 2023-08-08 VITALS — BP 129/80 | HR 107 | Temp 98.0°F | Resp 18 | Ht 66.0 in | Wt 213.6 lb

## 2023-08-08 DIAGNOSIS — D500 Iron deficiency anemia secondary to blood loss (chronic): Secondary | ICD-10-CM

## 2023-08-08 DIAGNOSIS — Z7984 Long term (current) use of oral hypoglycemic drugs: Secondary | ICD-10-CM | POA: Diagnosis not present

## 2023-08-08 DIAGNOSIS — N920 Excessive and frequent menstruation with regular cycle: Secondary | ICD-10-CM | POA: Insufficient documentation

## 2023-08-08 DIAGNOSIS — Z794 Long term (current) use of insulin: Secondary | ICD-10-CM | POA: Insufficient documentation

## 2023-08-08 DIAGNOSIS — E1065 Type 1 diabetes mellitus with hyperglycemia: Secondary | ICD-10-CM

## 2023-08-08 DIAGNOSIS — Z79899 Other long term (current) drug therapy: Secondary | ICD-10-CM | POA: Diagnosis not present

## 2023-08-08 DIAGNOSIS — K5900 Constipation, unspecified: Secondary | ICD-10-CM | POA: Insufficient documentation

## 2023-08-08 LAB — CBC WITH DIFFERENTIAL (CANCER CENTER ONLY)
Abs Immature Granulocytes: 0.01 10*3/uL (ref 0.00–0.07)
Basophils Absolute: 0.1 10*3/uL (ref 0.0–0.1)
Basophils Relative: 1 %
Eosinophils Absolute: 0.1 10*3/uL (ref 0.0–0.5)
Eosinophils Relative: 2 %
HCT: 38.7 % (ref 36.0–46.0)
Hemoglobin: 12.6 g/dL (ref 12.0–15.0)
Immature Granulocytes: 0 %
Lymphocytes Relative: 25 %
Lymphs Abs: 1.4 10*3/uL (ref 0.7–4.0)
MCH: 25.9 pg — ABNORMAL LOW (ref 26.0–34.0)
MCHC: 32.6 g/dL (ref 30.0–36.0)
MCV: 79.5 fL — ABNORMAL LOW (ref 80.0–100.0)
Monocytes Absolute: 0.3 10*3/uL (ref 0.1–1.0)
Monocytes Relative: 5 %
Neutro Abs: 3.8 10*3/uL (ref 1.7–7.7)
Neutrophils Relative %: 67 %
Platelet Count: 405 10*3/uL — ABNORMAL HIGH (ref 150–400)
RBC: 4.87 MIL/uL (ref 3.87–5.11)
RDW: 15.1 % (ref 11.5–15.5)
WBC Count: 5.8 10*3/uL (ref 4.0–10.5)
nRBC: 0 % (ref 0.0–0.2)

## 2023-08-08 LAB — FERRITIN: Ferritin: 29 ng/mL (ref 11–307)

## 2023-08-08 LAB — SEDIMENTATION RATE: Sed Rate: 16 mm/h (ref 0–22)

## 2023-08-08 LAB — IRON AND IRON BINDING CAPACITY (CC-WL,HP ONLY)
Iron: 81 ug/dL (ref 28–170)
Saturation Ratios: 17 % (ref 10.4–31.8)
TIBC: 475 ug/dL — ABNORMAL HIGH (ref 250–450)
UIBC: 394 ug/dL (ref 148–442)

## 2023-08-08 NOTE — Assessment & Plan Note (Signed)
 She has been taking iron supplement consistently over the past 5 weeks Because of her low iron is due to menorrhagia, although this is gradually improving I will order repeat CBC and iron studies If her iron studies show persistent low iron without improvement, I will schedule intravenous iron infusion

## 2023-08-08 NOTE — Progress Notes (Signed)
 [facility] CONSULT NOTE  Patient Care Team: Nella Bame, FNP as PCP - General (Nurse Practitioner)  ASSESSMENT & PLAN:  Iron deficiency anemia due to chronic blood loss She has been taking iron supplement consistently over the past 5 weeks Because of her low iron is due to menorrhagia, although this is gradually improving I will order repeat CBC and iron studies If her iron studies show persistent low iron without improvement, I will schedule intravenous iron infusion  Type 1 diabetes mellitus with hyperglycemia (HCC) The patient has recent poorly controlled diabetes Sometimes uncontrolled diabetes can also cause component of anemia chronic illness She will continue close vigilant dietary modification and lifestyle changes She has appointment to see endocrinologist next month Orders Placed This Encounter  Procedures   Ferritin    Standing Status:   Future    Number of Occurrences:   1    Expiration Date:   08/07/2024   Iron and Iron Binding Capacity ([facility] only)    Standing Status:   Future    Number of Occurrences:   1    Expiration Date:   08/07/2024   CBC with Differential (Cancer Center Only)    Standing Status:   Future    Number of Occurrences:   1    Expiration Date:   08/07/2024   Sedimentation rate    Standing Status:   Future    Number of Occurrences:   1    Expiration Date:   08/07/2024    All questions were answered. The patient knows to call the clinic with any problems, questions or concerns.  The total time spent in the appointment was 55 minutes encounter with patients including review of chart and various tests results, discussions about plan of care and coordination of care plan  Almeda Jacobs, MD 4/28/20251:51 PM   CHIEF COMPLAINTS/PURPOSE OF CONSULTATION:  Anemia  HISTORY OF PRESENTING ILLNESS:  Terri Mercer 33 y.o. female is here because of anemia  She was found to have abnormal CBC from recent blood work I reviewed  outside records On March 25, 2023, CBC showed white count 7.8, hemoglobin 13.1, MCV 80.6 and platelet count 378 On June 21, 2023, white count 6.8, hemoglobin 12.8, MCV 78.2 and platelet count of 409.  Vitamin B12 332, serum ferritin 7, TIBC high and iron saturation 11%  She denies recent chest pain on exertion, shortness of breath on minimal exertion, pre-syncopal episodes, or palpitations.  She complained of fatigue but she does have poor sleep She had not noticed any recent bleeding such as epistaxis, hematuria or hematochezia She has menorrhagia with passage of heavy menstruation in the first 2 days with each cycle.  She has regular menstrual cycle  The patient denies over the counter NSAID ingestion. She is not on antiplatelets agents.  She had no prior history or diagnosis of cancer. Her age appropriate screening programs are up-to-date. She denies any pica and eats a variety of diet. She never donated blood or received blood transfusion The patient was prescribed oral iron supplements and she takes 1 iron supplement twice daily usually with an empty stomach.  Initially, she had some mild nausea and constipation but that has subsequently resolved.  MEDICAL HISTORY:  Past Medical History:  Diagnosis Date   Diabetes mellitus without complication (HCC)     SURGICAL HISTORY: History reviewed. No pertinent surgical history.  SOCIAL HISTORY: Social History   Socioeconomic History   Marital status: Single    Spouse name: Not on  file   Number of children: Not on file   Years of education: Not on file   Highest education level: Not on file  Occupational History   Not on file  Tobacco Use   Smoking status: Never   Smokeless tobacco: Not on file  Substance and Sexual Activity   Alcohol use: No   Drug use: No   Sexual activity: Not on file  Other Topics Concern   Not on file  Social History Narrative   Not on file   Social Drivers of Health   Financial Resource Strain:  Not on file  Food Insecurity: Not on file  Transportation Needs: Not on file  Physical Activity: Not on file  Stress: Not on file  Social Connections: Unknown (11/09/2021)   Received from [facility]   Social Network    Social Network: Not on file  Intimate Partner Violence: Unknown (11/09/2021)   Received from [facility]   HITS    Physically Hurt: Not on file    Insult or Talk Down To: Not on file    Threaten Physical Harm: Not on file    Scream or Curse: Not on file    FAMILY HISTORY: History reviewed. No pertinent family history.  ALLERGIES:  is allergic to bee venom and lactose intolerance (gi).  MEDICATIONS:  Current Outpatient Medications  Medication Sig Dispense Refill   Continuous Glucose Sensor (DEXCOM G7 SENSOR) MISC 1 Device by Does not apply route as directed. 9 each 3   famotidine (PEPCID) 40 MG tablet Take 40 mg by mouth daily.     fenofibrate micronized (LOFIBRA) 67 MG capsule Take 134 mg by mouth daily.     ferrous sulfate 325 (65 FE) MG tablet Take 325 mg by mouth 3 (three) times a week.     insulin  aspart (FIASP  FLEXTOUCH) 100 UNIT/ML FlexTouch Pen Max Daily 45 units 45 mL 3   insulin  degludec (TRESIBA  FLEXTOUCH) 100 UNIT/ML FlexTouch Pen Inject 46 Units into the skin daily. 45 mL 3   Insulin  Pen Needle 32G X 4 MM MISC 1 Device by Does not apply route in the morning, at noon, in the evening, and at bedtime. 400 each 3   levothyroxine  (SYNTHROID ) 150 MCG tablet Take 1 tablet (150 mcg total) by mouth as directed. 1.5 tabs on Sundays and 1 tab rest of the week 98 tablet 2   lisinopril (PRINIVIL,ZESTRIL) 2.5 MG tablet Take 2.5 mg by mouth daily.     meloxicam  (MOBIC ) 15 MG tablet Take 1/2 to 1 tablet at bedtime as needed for leg pain. 15 tablet 0   metFORMIN  (GLUCOPHAGE -XR) 500 MG 24 hr tablet Take 2 tablets (1,000 mg total) by mouth daily with breakfast. 180 tablet 3   rosuvastatin (CRESTOR) 10 MG tablet Take 10 mg by mouth at bedtime.     RYBELSUS  14 MG TABS  TAKE 1 TABLET (14 MG TOTAL) BY MOUTH EVERY MORNING. 90 tablet 3   tiZANidine (ZANAFLEX) 4 MG tablet Take 4 mg by mouth every 6 (six) hours as needed.     No current facility-administered medications for this visit.    REVIEW OF SYSTEMS:   Constitutional: Denies fevers, chills or abnormal night sweats Eyes: Denies blurriness of vision, double vision or watery eyes Ears, nose, mouth, throat, and face: Denies mucositis or sore throat Respiratory: Denies cough, dyspnea or wheezes Cardiovascular: Denies palpitation, chest discomfort or lower extremity swelling Gastrointestinal:  Denies nausea, heartburn or change in bowel habits Skin: Denies abnormal skin rashes Lymphatics:  Denies new lymphadenopathy or easy bruising Neurological:Denies numbness, tingling or new weaknesses Behavioral/Psych: Mood is stable, no new changes  All other systems were reviewed with the patient and are negative.  PHYSICAL EXAMINATION: ECOG PERFORMANCE STATUS: 1 - Symptomatic but completely ambulatory  Vitals:   08/08/23 1258  BP: 129/80  Pulse: (!) 107  Resp: 18  Temp: 98 F (36.7 C)  SpO2: 98%   Filed Weights   08/08/23 1258  Weight: 213 lb 9.6 oz (96.9 kg)    GENERAL:alert, no distress and comfortable SKIN: skin color, texture, turgor are normal, no rashes or significant lesions EYES: normal, conjunctiva are pink and non-injected, sclera clear OROPHARYNX:no exudate, no erythema and lips, buccal mucosa, and tongue normal  NECK: supple, thyroid  normal size, non-tender, without nodularity LYMPH:  no palpable lymphadenopathy in the cervical, axillary or inguinal LUNGS: clear to auscultation and percussion with normal breathing effort HEART: regular rate & rhythm and no murmurs and no lower extremity edema ABDOMEN:abdomen soft, non-tender and normal bowel sounds Musculoskeletal:no cyanosis of digits and no clubbing  PSYCH: alert & oriented x 3 with fluent speech NEURO: no focal motor/sensory  deficits

## 2023-08-08 NOTE — Assessment & Plan Note (Signed)
 The patient has recent poorly controlled diabetes Sometimes uncontrolled diabetes can also cause component of anemia chronic illness She will continue close vigilant dietary modification and lifestyle changes She has appointment to see endocrinologist next month

## 2023-08-09 ENCOUNTER — Telehealth: Payer: Self-pay

## 2023-08-09 ENCOUNTER — Other Ambulatory Visit: Payer: Self-pay | Admitting: Hematology and Oncology

## 2023-08-09 DIAGNOSIS — D500 Iron deficiency anemia secondary to blood loss (chronic): Secondary | ICD-10-CM

## 2023-08-09 NOTE — Telephone Encounter (Signed)
 Called and given below message from Dr. Marton Sleeper, repeat labs are better. I do not think she needs IV iron right now, iron studies are improving.  However, if she feels really bad and wants to try IV iron, let me know, and I will schedule it.  For follow-up I can see her back in 3 months with repeat labs or she can get her iron studies checked by PCP, let me know her decision. Terri Mercer verbalized understanding.  She would like to follow up with labs and see Dr. Marton Sleeper in 3 months and she would like to try IV iron to see if it helps.  Forwarded above message to Dr. Marton Sleeper.

## 2023-08-09 NOTE — Telephone Encounter (Signed)
 Dr. Marton Sleeper, patient will be scheduled as soon as possible.  Auth Submission: NO AUTH NEEDED Site of care: Site of care: [facility] Payer: [employer] state health plan Medication & CPT/J Code(s) submitted: Venofer (Iron Sucrose) J1756 Route of submission (phone, fax, portal): portal Phone # Fax # Auth type: Buy/Bill PB Units/visits requested: 300mg  x 1 dose Reference number:  Approval from: 08/09/23 to 01/09/24

## 2023-08-11 ENCOUNTER — Telehealth: Payer: Self-pay | Admitting: Hematology and Oncology

## 2023-08-11 NOTE — Telephone Encounter (Signed)
 Confirmed with pt scheduled appt date and time

## 2023-08-17 ENCOUNTER — Encounter: Payer: Self-pay | Admitting: Internal Medicine

## 2023-08-17 ENCOUNTER — Ambulatory Visit: Admitting: Internal Medicine

## 2023-08-17 VITALS — BP 124/78 | HR 88 | Ht 66.0 in | Wt 211.0 lb

## 2023-08-17 DIAGNOSIS — E1065 Type 1 diabetes mellitus with hyperglycemia: Secondary | ICD-10-CM | POA: Diagnosis not present

## 2023-08-17 DIAGNOSIS — E063 Autoimmune thyroiditis: Secondary | ICD-10-CM | POA: Diagnosis not present

## 2023-08-17 MED ORDER — OMNIPOD 5 G7 INTRO (GEN 5) KIT: 1.0000 | PACK | 0 refills | Status: DC

## 2023-08-17 MED ORDER — OMNIPOD 5 G7 PODS (GEN 5) MISC: 1.0000 | 3 refills | Status: DC

## 2023-08-17 NOTE — Patient Instructions (Addendum)
 Continue Metformin  500mg  XR, TWO tabs daily  Continue Rybelsus  14 mg daily  Take Novolog  4 units before Breakfast, Lunch and 6 units with Supper Continue Tresiba  48 units daily  Novolog  correctional insulin : ADD extra units on insulin  to your meal-time Novolog  dose if your blood sugars are higher than 160. Use the scale below to help guide you:   Blood sugar before meal Number of units to inject  Less than 160 0 unit  161 -  190 1 units  191 -  220 2 units  221 -  250 3 units  251 -  280 4 units  281 -  310 5 units  311 -  340 6 units  341 -  370 7 units        HOW TO TREAT LOW BLOOD SUGARS (Blood sugar LESS THAN 70 MG/DL) Please follow the RULE OF 15 for the treatment of hypoglycemia treatment (when your (blood sugars are less than 70 mg/dL)   STEP 1: Take 15 grams of carbohydrates when your blood sugar is low, which includes:  3-4 GLUCOSE TABS  OR 3-4 OZ OF JUICE OR REGULAR SODA OR ONE TUBE OF GLUCOSE GEL    STEP 2: RECHECK blood sugar in 15 MINUTES STEP 3: If your blood sugar is still low at the 15 minute recheck --> then, go back to STEP 1 and treat AGAIN with another 15 grams of carbohydrates.

## 2023-08-17 NOTE — Progress Notes (Signed)
 Name: Terri Mercer  MRN/ DOB: [id number], [birth year]   Age/ Sex: 33 y.o., female    PCP: Nella Bame, FNP   Reason for Endocrinology Evaluation: Type 1 Diabetes Mellitus/Hashimoto's thyroiditis     Date of Initial Endocrinology Visit: 12/20/2022    PATIENT IDENTIFIER: Terri Mercer is a 33 y.o. female with a past medical history of DM, Hashimoto's Thyroiditis. The patient presented for initial endocrinology clinic visit on 12/20/2022 for consultative assistance with her diabetes management.    HPI: Terri Mercer was    Diagnosed with DM 2006 at age 57 Prior Medications tried/Intolerance: intolerant to Metformin  - persistent hyperglycemia, victoza- nausea/vomiting  , glimepiride . Started on insulin  prior to 2022     Hemoglobin A1c has ranged from 8.3% in 01/2022, peaking at 12.5% in 10/2021. Pt with hx of DKA 2022   On her initial visit to our clinic she had an A1c 7.6%, she was on Synjardy , Rybelsus , Tresiba .  We adjusted Tresiba    GAD-65 elevated at 220 we will change her diagnosis to T1 DM 12/2022  We discontinued Jardiance and just started her on plain metformin  at the time  THYROID  HISTORY She has been noted with elevated TPO Ab in 2023. She was diagnosed with hypothyroidism at age 76 in 63  Mother with Hashimoto's thyroiditis  Based on patient report of the presence of thyroid  nodules, thyroid  ultrasound was performed 12/2022 that did NOT reveal any nodules but rather mildly heterogeneous and enlarged thyroid  gland consistent with clinical history of Hashimoto's thyroiditis with no discrete nodules or significant hypervascularity.   SUBJECTIVE:   During the last visit (04/27/2023): A1c 8.4%    Today (08/17/23): Terri Mercer is here for follow-up on diabetes management and hypothyroid.  She checks her blood sugars multiple times daily. The patient has not had hypoglycemic episodes since the last clinic visit.    Patient was evaluated by  hematology for iron deficiency anemia due to chronic blood loss 07/2023 Has been recently diagnosed with OSA  She is scheduled for an infusion the end of this month  Has occasional nausea that she attributes to iron intake  Has occasional constipation  Continues with occasional palpitations   Forgets to add the additional half a tablet of levothyroxine  OmniPod training  HOME ENDOCRINE REGIMEN: Metformin  500 mg XR, 2 tablet daily  Rybelsus  14 mg daily  Tresiba  48 units daily  Novolog  4 units TIDQAC Levothyroxine  150 mcg, 1.5 tab on Sundays and 1 tab rest of the week     Statin: yes ACE-I/ARB:yes Prior Diabetic Education: yes   CONTINUOUS GLUCOSE MONITORING RECORD INTERPRETATION    Dates of Recording: 4/24-08/17/2023  Sensor description: Dexcom  Results statistics:   CGM use % of time 94  Average and SD 179/37  Time in range  57 %  % Time Above 180 39  % Time above 250 4  % Time Below target 0   Glycemic patterns summary: BGs are mostly optimal overnight and fluctuate during the day  Hyperglycemic episodes postprandial  Hypoglycemic episodes occurred N/A  Overnight periods: Variable but mostly optimal    DIABETIC COMPLICATIONS: Microvascular complications:   Denies: retinopathy, CKD , neuropathy  Last eye exam: Completed 12/2021  Macrovascular complications:   Denies: CAD, PVD, CVA   PAST HISTORY: Past Medical History:  Past Medical History:  Diagnosis Date   Diabetes mellitus without complication (HCC)    Past Surgical History: No past surgical history on file.  Social History:  reports that  she has never smoked. She does not have any smokeless tobacco history on file. She reports that she does not drink alcohol and does not use drugs. Family History: No family history on file.   HOME MEDICATIONS: Allergies as of 08/17/2023       Reactions   Bee Venom Swelling   Localized swelling   Lactose Intolerance (gi) Diarrhea        Medication List         Accurate as of Aug 17, 2023  9:50 AM. If you have any questions, ask your nurse or doctor.          Dexcom G7 Sensor Misc 1 Device by Does not apply route as directed.   EPINEPHrine 0.3 mg/0.3 mL Soaj injection Commonly known as: EPI-PEN SMARTSIG:0.3 Milliliter(s) IM 1-2 Times Daily   famotidine 40 MG tablet Commonly known as: PEPCID Take 40 mg by mouth daily.   fenofibrate micronized 67 MG capsule Commonly known as: LOFIBRA Take 134 mg by mouth daily.   ferrous sulfate 325 (65 FE) MG tablet Take 325 mg by mouth 3 (three) times a week.   Fiasp  FlexTouch 100 UNIT/ML FlexTouch Pen Generic drug: insulin  aspart Max Daily 45 units   Insulin  Pen Needle 32G X 4 MM Misc 1 Device by Does not apply route in the morning, at noon, in the evening, and at bedtime.   levothyroxine  150 MCG tablet Commonly known as: SYNTHROID  Take 1 tablet (150 mcg total) by mouth as directed. 1.5 tabs on Sundays and 1 tab rest of the week   lisinopril 2.5 MG tablet Commonly known as: ZESTRIL Take 2.5 mg by mouth daily.   meloxicam  15 MG tablet Commonly known as: Mobic  Take 1/2 to 1 tablet at bedtime as needed for leg pain.   metFORMIN  500 MG 24 hr tablet Commonly known as: GLUCOPHAGE -XR Take 2 tablets (1,000 mg total) by mouth daily with breakfast.   rosuvastatin 10 MG tablet Commonly known as: CRESTOR Take 10 mg by mouth at bedtime.   Rybelsus  14 MG Tabs Generic drug: Semaglutide  TAKE 1 TABLET (14 MG TOTAL) BY MOUTH EVERY MORNING.   tiZANidine 4 MG tablet Commonly known as: ZANAFLEX Take 4 mg by mouth every 6 (six) hours as needed.   Tresiba  FlexTouch 100 UNIT/ML FlexTouch Pen Generic drug: insulin  degludec Inject 46 Units into the skin daily. What changed: how much to take         ALLERGIES: Allergies  Allergen Reactions   Bee Venom Swelling    Localized swelling   Lactose Intolerance (Gi) Diarrhea     REVIEW OF SYSTEMS: A comprehensive ROS was conducted with  the patient and is negative except as per HPI     OBJECTIVE:   VITAL SIGNS: BP 124/78 (BP Location: Left Arm, Patient Position: Sitting)   Pulse 88   Ht 5\' 6"  (1.676 m)   Wt 211 lb (95.7 kg)   SpO2 99%   BMI 34.06 kg/m    PHYSICAL EXAM:  General: Pt appears well and is in NAD  Lungs: Clear with good BS bilat   Heart: RRR   Abdomen:  soft, nontender  Extremities:  Lower extremities - No pretibial edema.   Neuro: MS is good with appropriate affect, pt is alert and Ox3    DM foot exam: 08/17/2023  The skin of the feet is intact without sores or ulcerations. The pedal pulses are 2+ on right and 2+ on left. The sensation is intact to a screening 5.07, 10  gram monofilament bilaterally   DATA REVIEWED:  Lab Results  Component Value Date   HGBA1C 7.6 (A) 12/20/2022    06/22/2023  HDL 45 LDL 97 Tg 185 BUN 11 Cr. 0.73 GFR 111 A1c 9.5   21/13/2025 2.89 uIU/mL      Latest Reference Range & Units 12/20/22 08:15  Creatinine,U mg/dL 16.1  Microalb, Ur 0.0 - 1.9 mg/dL 0.9  MICROALB/CREAT RATIO 0.0 - 30.0 mg/g 1.8    Latest Reference Range & Units 12/20/22 08:15  Glutamic Acid Decarb Ab <5 IU/mL 220 (H)     ASSESSMENT / PLAN / RECOMMENDATIONS:   1) Type 1 Diabetes Mellitus, Poorly Optimally controlled, Without complications - Most recent A1c of 9.5 %. Goal A1c < 7.0 %.    - A1c remain elevated despite escalating glycemic agents, patient assures me with compliance -Patient with elevated GAD -65 -Intolerant to Victoza and Ozempic due to GI side effects -Patient with history of DKA in 2022, SGLT2 inhibitors are contraindicated - In the past we have entertained the idea of switching Rybelsus  to Mounjaro but she has had severe GI side effects to injectables and opted to remain on Rybelsus  - She is not listed in insulin  pump technology, a prescription for OmniPod was sent to the pharmacy and a referral to our CDE - Labs were reviewed through PCPs  office   MEDICATIONS: Continue metformin  500 mg XR, 2 tabs daily Continue Rybelsus  14 mg daily Continue Tresiba  48 units daily Change Novolog  4 units with breakfast, 4 units with lunch, and 6 units with supper Continue CF: Novolog  (BG-130/30) TIDQAC  EDUCATION / INSTRUCTIONS: BG monitoring instructions: Patient is instructed to check her blood sugars 3 times a day, before meals. Call [facility] if: BG persistently < 70  I reviewed the Rule of 15 for the treatment of hypoglycemia in detail with the patient. Literature supplied.   2) Diabetic complications:  Eye: Does not have known diabetic retinopathy.  Neuro/ Feet: Does not have known diabetic peripheral neuropathy. Renal: Patient does not have known baseline CKD. She is  on an ACEI/ARB at present.  3)Hashimoto's Thyroiditis :  -Patient is clinically and biochemically euthyroid -TSH was normal through her PCPs office in December, 2024 -No change   Medication Continue levothyroxine  150 mcg, 1.5 tabs on Sundays and 1 tablet rest of the week  Follow-up in 3 months   Signed electronically by: Natale Bail, MD  [facility]  [facility] [address] [place], [place] [address] Phone: [phone] FAX: [phone]   CC: Nella Bame, FNP [address] [place] [address] Phone: [phone]  Fax: [phone]    Return to Endocrinology clinic as below: Future Appointments  Date Time Provider Department Center  09/08/2023  9:00 AM [facility] None  11/08/2023 10:15 AM [facility] None  11/15/2023  8:40 AM Almeda Jacobs, MD [facility] None

## 2023-08-18 ENCOUNTER — Telehealth: Payer: Self-pay | Admitting: Dietician

## 2023-08-18 ENCOUNTER — Telehealth: Payer: Self-pay

## 2023-08-18 ENCOUNTER — Encounter: Payer: Self-pay | Admitting: Internal Medicine

## 2023-08-18 NOTE — Telephone Encounter (Signed)
 Patient needs to be scheduled for pump training.

## 2023-08-18 NOTE — Telephone Encounter (Signed)
 Called patient and set up an appointment for Omnipod 5 training on Aug 24, 2023 at 3 pm.  Discussed the following as well as sent her a my chart message with the following information:  You may take your long acting insulin  the night before the appointment unless instructed otherwise by your trainer as you typically take your long acting insulin  at night and are being seen late.  Please bring a vial of rapid acting (Novolog , Humalog) insulin  to appointment. Checked with patient if refill of vial of rapid acting (Novolog , Humalog) insulin   is necessary. OR meal time insulin  per MD choice.  2.  Please bring the following  1 personal diabetes manager (PDM) device 2 boxes (each box = 5 pods) of Omnipod 5 pods - The name of your continuous glucose monitor that you are using with the pump must be on the box. pod pals (stickers that go over pod to assist with adhesion) PDM charger  User manual   Please set up the following accounts in advance on the following websites. You MUST have user names and passwords available at [facility] training appointment. [email or web address]   I will message MD for a vial of insulin  for training be sent to her pharmacy. Will leave pump order request on MD desk.  Cydne Doyne, RD, LDN, CDCES, DipACLM

## 2023-08-19 ENCOUNTER — Other Ambulatory Visit: Payer: Self-pay | Admitting: Internal Medicine

## 2023-08-19 MED ORDER — INSULIN ASPART 100 UNIT/ML IJ SOLN: INTRAMUSCULAR | 2 refills | Status: DC

## 2023-08-24 ENCOUNTER — Encounter: Attending: Internal Medicine | Admitting: Nutrition

## 2023-08-24 DIAGNOSIS — E1165 Type 2 diabetes mellitus with hyperglycemia: Secondary | ICD-10-CM | POA: Insufficient documentation

## 2023-08-24 DIAGNOSIS — Z794 Long term (current) use of insulin: Secondary | ICD-10-CM | POA: Insufficient documentation

## 2023-08-24 NOTE — Patient Instructions (Signed)
 Bolus for all meals and snacks Do a correction dose any time blood sugar is over 225 Change pod every 3 days Call OmniPod if questions or problems with the pump.

## 2023-08-24 NOTE — Progress Notes (Signed)
 Terri Mercer was trained on the use of the OmniPod5 insulin  pump.  Settings were put in per DR. Shamleffer's orders: Basal rate: 1.5u/hr, max basal: 3.0u/hr,  I/C: 1/1, with directions for 4g at each meal, ISF: 30, timing: 4 hrs, max bolus: 6u, Target: 110 with correction over 130.   She was shown how to fill a pod, give a bolus, do a correction dose, and we discussed alerts and alarms.  She filled a pod with Novolog  insulin  and attached this to her left leg without difficulty.  The pod was started at 4 PM.  She did a correction dose of insulin  because her blood sugar was 141 4 hours after lunch  Her PDM was linked to [employer], and her Dexcom is linked to [facility].  WE discussed high blood sugar protocols, sick day guidelines, alerts and alarms, and he reported good understanding of all topics.  We discussed how this pump delivers insulin , and the need to bolus before all meals and snacks, making sure the blood sugar reading is put into the bolus calculation, to do a correction dose any time the blood sugar is over 225. He was given a pump handout  with information on all topics above and directed to read them and to call if questions.  She had no final questions for me.  [employer] user name:[email or web address]  PW: [email or web address]

## 2023-08-25 ENCOUNTER — Telehealth: Payer: Self-pay | Admitting: Nutrition

## 2023-08-25 NOTE — Telephone Encounter (Signed)
 Patient reported no difficulty giving boluses yesterday and today.  Said FBS was 110 and had no low blood sugars.  Says ate pizza last night and blood sugar rose as usual.  Discussed need to take 1-2u more  of insulin  when she know certain meals raise blood sugar.  She agreed and no final questions for me.

## 2023-09-08 ENCOUNTER — Ambulatory Visit (INDEPENDENT_AMBULATORY_CARE_PROVIDER_SITE_OTHER)

## 2023-09-08 VITALS — BP 120/83 | HR 89 | Temp 97.4°F | Resp 16 | Ht 66.0 in | Wt 212.2 lb

## 2023-09-08 DIAGNOSIS — N920 Excessive and frequent menstruation with regular cycle: Secondary | ICD-10-CM

## 2023-09-08 DIAGNOSIS — D500 Iron deficiency anemia secondary to blood loss (chronic): Secondary | ICD-10-CM

## 2023-09-08 MED ORDER — ACETAMINOPHEN 325 MG PO TABS
650.0000 mg | ORAL_TABLET | Freq: Once | ORAL | Status: AC
  Administered 2023-09-08: 650 mg via ORAL
  Filled 2023-09-08: qty 2

## 2023-09-08 MED ORDER — SODIUM CHLORIDE 0.9 % IV SOLN
300.0000 mg | Freq: Once | INTRAVENOUS | Status: AC
  Administered 2023-09-08: 300 mg via INTRAVENOUS
  Filled 2023-09-08: qty 15

## 2023-09-08 NOTE — Progress Notes (Signed)
 Diagnosis: Iron Deficiency Anemia  Provider:  Mannam, Praveen MD  Procedure: IV Infusion  IV Type: Peripheral, IV Location: R Forearm  Venofer (Iron Sucrose), Dose: 300 mg  Infusion Start Time: 1610  Infusion Stop Time: 1125  Post Infusion IV Care: Observation period completed  Discharge: Condition: Good, Destination: Home . AVS Declined  Performed by:  Lendel Quant, RN

## 2023-09-09 ENCOUNTER — Ambulatory Visit: Payer: 59 | Admitting: Internal Medicine

## 2023-11-08 ENCOUNTER — Inpatient Hospital Stay: Attending: Hematology and Oncology

## 2023-11-08 DIAGNOSIS — D500 Iron deficiency anemia secondary to blood loss (chronic): Secondary | ICD-10-CM | POA: Diagnosis present

## 2023-11-08 LAB — FERRITIN: Ferritin: 51 ng/mL (ref 11–307)

## 2023-11-08 LAB — CBC WITH DIFFERENTIAL (CANCER CENTER ONLY)
Abs Immature Granulocytes: 0.01 K/uL (ref 0.00–0.07)
Basophils Absolute: 0.1 K/uL (ref 0.0–0.1)
Basophils Relative: 1 %
Eosinophils Absolute: 0.2 K/uL (ref 0.0–0.5)
Eosinophils Relative: 3 %
HCT: 39.2 % (ref 36.0–46.0)
Hemoglobin: 13 g/dL (ref 12.0–15.0)
Immature Granulocytes: 0 %
Lymphocytes Relative: 27 %
Lymphs Abs: 1.5 K/uL (ref 0.7–4.0)
MCH: 27.5 pg (ref 26.0–34.0)
MCHC: 33.2 g/dL (ref 30.0–36.0)
MCV: 82.9 fL (ref 80.0–100.0)
Monocytes Absolute: 0.3 K/uL (ref 0.1–1.0)
Monocytes Relative: 5 %
Neutro Abs: 3.6 K/uL (ref 1.7–7.7)
Neutrophils Relative %: 64 %
Platelet Count: 366 K/uL (ref 150–400)
RBC: 4.73 MIL/uL (ref 3.87–5.11)
RDW: 13.4 % (ref 11.5–15.5)
WBC Count: 5.6 K/uL (ref 4.0–10.5)
nRBC: 0 % (ref 0.0–0.2)

## 2023-11-08 LAB — IRON AND IRON BINDING CAPACITY (CC-WL,HP ONLY)
Iron: 65 ug/dL (ref 28–170)
Saturation Ratios: 16 % (ref 10.4–31.8)
TIBC: 417 ug/dL (ref 250–450)
UIBC: 352 ug/dL (ref 148–442)

## 2023-11-08 LAB — SEDIMENTATION RATE: Sed Rate: 6 mm/h (ref 0–22)

## 2023-11-12 ENCOUNTER — Other Ambulatory Visit: Payer: Self-pay | Admitting: Internal Medicine

## 2023-11-15 ENCOUNTER — Encounter: Payer: Self-pay | Admitting: Hematology and Oncology

## 2023-11-15 ENCOUNTER — Inpatient Hospital Stay: Attending: Hematology and Oncology | Admitting: Hematology and Oncology

## 2023-11-15 VITALS — BP 121/88 | HR 96 | Temp 97.9°F | Resp 18 | Ht 66.0 in | Wt 213.4 lb

## 2023-11-15 DIAGNOSIS — Z79899 Other long term (current) drug therapy: Secondary | ICD-10-CM | POA: Insufficient documentation

## 2023-11-15 DIAGNOSIS — R110 Nausea: Secondary | ICD-10-CM | POA: Diagnosis not present

## 2023-11-15 DIAGNOSIS — D500 Iron deficiency anemia secondary to blood loss (chronic): Secondary | ICD-10-CM | POA: Insufficient documentation

## 2023-11-15 DIAGNOSIS — K5900 Constipation, unspecified: Secondary | ICD-10-CM | POA: Insufficient documentation

## 2023-11-15 DIAGNOSIS — R5383 Other fatigue: Secondary | ICD-10-CM | POA: Insufficient documentation

## 2023-11-15 NOTE — Progress Notes (Signed)
   [facility] OFFICE PROGRESS NOTE  Lavonia Iha, FNP  ASSESSMENT & PLAN:  Assessment & Plan Iron  deficiency anemia due to chronic blood loss She tolerated intravenous iron  infusion well She has normalization of her CBC and with adequate iron  store after just 1 dose of intravenous iron  sucrose It is not clear to me when she might need intravenous iron  again She is seeing her primary care doctor and her endocrinologist on the frequent basis We discussed future follow-up She is comfortable asking her other physicians to monitor her iron  studies and if she become iron  deficient again, she will contact me and I will schedule more intravenous iron  infusions    No orders of the defined types were placed in this encounter.   INTERVAL HISTORY: Patient returns for recurrent anemia Symptoms of anemia includes none We reviewed CBC and iron  studies  SUMMARY OF HEMATOLOGIC HISTORY: She was found to have abnormal CBC from recent blood work I reviewed outside records On March 25, 2023, CBC showed white count 7.8, hemoglobin 13.1, MCV 80.6 and platelet count 378 On June 21, 2023, white count 6.8, hemoglobin 12.8, MCV 78.2 and platelet count of 409.  Vitamin B12 332, serum ferritin 7, TIBC high and iron  saturation 11%  She denies recent chest pain on exertion, shortness of breath on minimal exertion, pre-syncopal episodes, or palpitations.  She complained of fatigue but she does have poor sleep She had not noticed any recent bleeding such as epistaxis, hematuria or hematochezia She has menorrhagia with passage of heavy menstruation in the first 2 days with each cycle.  She has regular menstrual cycle  The patient denies over the counter NSAID ingestion. She is not on antiplatelets agents.  She had no prior history or diagnosis of cancer. Her age appropriate screening programs are up-to-date. She denies any pica and eats a variety of diet. She never donated blood or  received blood transfusion The patient was prescribed oral iron  supplements and she takes 1 iron  supplement twice daily usually with an empty stomach.  Initially, she had some mild nausea and constipation but that has subsequently resolved. She received 1 dose of intravenous iron  sucrose in May 2025  Lab Results  Component Value Date   FERRITIN 51 11/08/2023   Vitals:   11/15/23 0853  BP: 121/88  Pulse: 96  Resp: 18  Temp: 97.9 F (36.6 C)  SpO2: 100%

## 2023-11-15 NOTE — Assessment & Plan Note (Addendum)
 She tolerated intravenous iron  infusion well She has normalization of her CBC and with adequate iron  store after just 1 dose of intravenous iron  sucrose It is not clear to me when she might need intravenous iron  again She is seeing her primary care doctor and her endocrinologist on the frequent basis We discussed future follow-up She is comfortable asking her other physicians to monitor her iron  studies and if she become iron  deficient again, she will contact me and I will schedule more intravenous iron  infusions

## 2023-11-18 ENCOUNTER — Encounter: Payer: Self-pay | Admitting: Internal Medicine

## 2023-11-18 ENCOUNTER — Ambulatory Visit: Admitting: Internal Medicine

## 2023-11-18 VITALS — BP 114/76 | HR 89 | Ht 66.0 in | Wt 211.4 lb

## 2023-11-18 DIAGNOSIS — E063 Autoimmune thyroiditis: Secondary | ICD-10-CM

## 2023-11-18 DIAGNOSIS — E1065 Type 1 diabetes mellitus with hyperglycemia: Secondary | ICD-10-CM

## 2023-11-18 LAB — POCT GLYCOSYLATED HEMOGLOBIN (HGB A1C): Hemoglobin A1C: 7.2 % — AB (ref 4.0–5.6)

## 2023-11-18 MED ORDER — RYBELSUS 14 MG PO TABS: 1.0000 | ORAL_TABLET | Freq: Every morning | ORAL | 3 refills | Status: DC

## 2023-11-18 MED ORDER — INSULIN ASPART 100 UNIT/ML IJ SOLN: INTRAMUSCULAR | 3 refills | Status: DC

## 2023-11-18 MED ORDER — METFORMIN HCL ER 500 MG PO TB24: 1000.0000 mg | ORAL_TABLET | Freq: Every day | ORAL | 3 refills | Status: DC

## 2023-11-18 NOTE — Patient Instructions (Signed)
 Continue Metformin  500mg  XR, TWO tabs daily  Continue Rybelsus  14 mg daily    Enter 6 g of carbohydrates with breakfast and 8 -10 g with dinner     HOW TO TREAT LOW BLOOD SUGARS (Blood sugar LESS THAN 70 MG/DL) Please follow the RULE OF 15 for the treatment of hypoglycemia treatment (when your (blood sugars are less than 70 mg/dL)   STEP 1: Take 15 grams of carbohydrates when your blood sugar is low, which includes:  3-4 GLUCOSE TABS  OR 3-4 OZ OF JUICE OR REGULAR SODA OR ONE TUBE OF GLUCOSE GEL    STEP 2: RECHECK blood sugar in 15 MINUTES STEP 3: If your blood sugar is still low at the 15 minute recheck --> then, go back to STEP 1 and treat AGAIN with another 15 grams of carbohydrates.

## 2023-11-18 NOTE — Progress Notes (Signed)
 Name: Terri Mercer  MRN/ DOB: [id number], 1991/04/07   Age/ Sex: 33 y.o., female    PCP: Lavonia Iha, FNP   Reason for Endocrinology Evaluation: Type 1 Diabetes Mellitus/Hashimoto's thyroiditis     Date of Initial Endocrinology Visit: 12/20/2022    PATIENT IDENTIFIER: Terri Mercer is a 33 y.o. female with a past medical history of DM, Hashimoto's Thyroiditis. The patient presented for initial endocrinology clinic visit on 12/20/2022 for consultative assistance with her diabetes management.    HPI: [name] was    Diagnosed with DM 2006 at age 44 Prior Medications tried/Intolerance: intolerant to Metformin  - persistent hyperglycemia, victoza- nausea/vomiting  , glimepiride . Started on insulin  prior to 2022     Hemoglobin A1c has ranged from 8.3% in 01/2022, peaking at 12.5% in 10/2021. Pt with hx of DKA 2022   On her initial visit to our clinic she had an A1c 7.6%, she was on Synjardy , Rybelsus , Tresiba .  We adjusted Tresiba    GAD-65 elevated at 220 we will change her diagnosis to T1 DM 12/2022  We discontinued Jardiance and just started her on plain metformin  at the time  THYROID  HISTORY She has been noted with elevated TPO Ab in 2023. She was diagnosed with hypothyroidism at age 24 in 52  Mother with Hashimoto's thyroiditis  Based on patient report of the presence of thyroid  nodules, thyroid  ultrasound was performed 12/2022 that did NOT reveal any nodules but rather mildly heterogeneous and enlarged thyroid  gland consistent with clinical history of Hashimoto's thyroiditis with no discrete nodules or significant hypervascularity.   She was trained on the OmniPod use by our CDE May, 2025   SUBJECTIVE:   During the last visit (04/27/2023): A1c 9.5%    Today (11/18/23): [name] is here for follow-up on diabetes management and hypothyroid.  She checks her blood sugars multiple times daily. The patient has not had hypoglycemic episodes since  the last clinic visit.    Patient continues to follow-up with oncology for iron  deficiency anemia, she has been receiving intravenous iron  infusions, with the last infusion 09/08/2023  Has had nausea after being out in the heat Has had occasional constipation  Has noted changed in menstruations  This patient with type 2 diabetes is treated with OmniPod (insulin  pump). During the visit the pump basal and bolus doses were reviewed including carb/insulin  rations and supplemental doses. The clinical list was updated. The glucose meter download was reviewed in detail to determine if the current pump settings are providing the best glycemic control without excessive hypoglycemia.  Pump and meter download:    Pump   OmniPod Settings   Insulin  type   Novolog     Basal rate       0000 1.5 u/h               I:C ratio       0000 1:1    Enter # 4g              Sensitivity       0000  30      Goal       0000  110            Type & Model of Pump: OmniPod Insulin  Type: Currently using Novolog .  Body mass index is 34.12 kg/m.  PUMP STATISTICS: Average BG: 191  Average Daily Carbs (g): 12.4  Average Total Daily Insulin : 68.1  Average Daily Basal: 54.1 (79 %) Average Daily Bolus: 14 (  21 %)    HOME ENDOCRINE REGIMEN: Metformin  500 mg XR, 2 tablet daily  Rybelsus  14 mg daily  Novolog   Levothyroxine  150 mcg, 1.5 tab on Sundays and 1 tab rest of the week     Statin: yes ACE-I/ARB:yes Prior Diabetic Education: yes   CONTINUOUS GLUCOSE MONITORING RECORD INTERPRETATION    Dates of Recording: 7/26-11/18/2023  Sensor description: Dexcom  Results statistics:   CGM use % of time 91.5  Average and SD 191/49  Time in range 48 %  % Time Above 180 41  % Time above 250 11  % Time Below target 0   Glycemic patterns summary: BGs are optimal overnight and between the meals  Hyperglycemic episodes postprandial  Hypoglycemic episodes occurred N/A  Overnight periods:  Optimal    DIABETIC COMPLICATIONS: Microvascular complications:   Denies: retinopathy, CKD , neuropathy  Last eye exam: Completed 01/17/2024  Macrovascular complications:   Denies: CAD, PVD, CVA   PAST HISTORY: Past Medical History:  Past Medical History:  Diagnosis Date   Diabetes mellitus without complication (HCC)    Past Surgical History: No past surgical history on file.  Social History:  reports that she has never smoked. She does not have any smokeless tobacco history on file. She reports that she does not drink alcohol and does not use drugs. Family History: No family history on file.   HOME MEDICATIONS: Allergies as of 11/18/2023       Reactions   Bee Venom Swelling   Localized swelling   Lactose Intolerance (gi) Diarrhea        Medication List        Accurate as of November 18, 2023 10:45 AM. If you have any questions, ask your nurse or doctor.          STOP taking these medications    Insulin  Pen Needle 32G X 4 MM Misc Stopped by: Donell PARAS Leodis Alcocer   Tresiba  FlexTouch 100 UNIT/ML FlexTouch Pen Generic drug: insulin  degludec Stopped by: Renley Banwart J Treshaun Carrico       TAKE these medications    Dexcom G7 Sensor Misc 1 Device by Does not apply route as directed.   EPINEPHrine 0.3 mg/0.3 mL Soaj injection Commonly known as: EPI-PEN SMARTSIG:0.3 Milliliter(s) IM 1-2 Times Daily   famotidine 40 MG tablet Commonly known as: PEPCID Take 40 mg by mouth daily.   fenofibrate micronized 67 MG capsule Commonly known as: LOFIBRA Take 134 mg by mouth daily.   ferrous sulfate 325 (65 FE) MG tablet Take 325 mg by mouth 3 (three) times a week.   insulin  aspart 100 UNIT/ML injection Commonly known as: novoLOG  90 units a day per pump What changed: additional instructions Changed by: Donell PARAS Hollyanne Schloesser   levothyroxine  150 MCG tablet Commonly known as: SYNTHROID  TAKE 1 TABLET (150 MCG TOTAL) BY MOUTH AS DIRECTED. 1.5 TABS ON SUNDAYS AND 1 TAB REST  OF THE WEEK   lisinopril 2.5 MG tablet Commonly known as: ZESTRIL Take 2.5 mg by mouth daily.   meloxicam  15 MG tablet Commonly known as: Mobic  Take 1/2 to 1 tablet at bedtime as needed for leg pain.   metFORMIN  500 MG 24 hr tablet Commonly known as: GLUCOPHAGE -XR Take 2 tablets (1,000 mg total) by mouth daily with breakfast.   Omnipod 5 G7 Pods (Gen 5) Misc 1 Device by Does not apply route every other day.   Omnipod 5 DexG7G6 Intro Gen 5 Kit USE 1 DEVICE BY DOES NOT APPLY ROUTE EVERY OTHER DAY.  rosuvastatin 10 MG tablet Commonly known as: CRESTOR Take 10 mg by mouth at bedtime.   Rybelsus  14 MG Tabs Generic drug: Semaglutide  Take 1 tablet (14 mg total) by mouth every morning.   tiZANidine 4 MG tablet Commonly known as: ZANAFLEX Take 4 mg by mouth every 6 (six) hours as needed.         ALLERGIES: Allergies  Allergen Reactions   Bee Venom Swelling    Localized swelling   Lactose Intolerance (Gi) Diarrhea     REVIEW OF SYSTEMS: A comprehensive ROS was conducted with the patient and is negative except as per HPI     OBJECTIVE:   VITAL SIGNS: BP 114/76 (BP Location: Left Arm, Patient Position: Sitting, Cuff Size: Normal)   Pulse 89   Ht 5' 6 (1.676 m)   Wt 211 lb 6.4 oz (95.9 kg)   SpO2 98%   BMI 34.12 kg/m    PHYSICAL EXAM:  General: Pt appears well and is in NAD  Lungs: Clear with good BS bilat   Heart: RRR   Abdomen:  soft, nontender  Extremities:  Lower extremities - No pretibial edema.   Neuro: MS is good with appropriate affect, pt is alert and Ox3    DM foot exam: 08/17/2023  The skin of the feet is intact without sores or ulcerations. The pedal pulses are 2+ on right and 2+ on left. The sensation is intact to a screening 5.07, 10 gram monofilament bilaterally   DATA REVIEWED:  Lab Results  Component Value Date   HGBA1C 7.2 (A) 11/18/2023   HGBA1C 7.6 (A) 12/20/2022    Latest Reference Range & Units 11/18/23 08:39  TSH mIU/L  2.17  T4,Free(Direct) 0.8 - 1.8 ng/dL 1.9 (H)    Latest Reference Range & Units 11/18/23 08:50  Microalb, Ur mg/dL 2.3  MICROALB/CREAT RATIO <30 mg/g creat 18  Creatinine, Urine 20 - 275 mg/dL 871     6/87/7974  HDL 45 LDL 97 Tg 185 BUN 11 Cr. 0.73 GFR 111 A1c 9.5   21/13/2025 2.89 uIU/mL      Latest Reference Range & Units 12/20/22 08:15  Creatinine,U mg/dL 52.0  Microalb, Ur 0.0 - 1.9 mg/dL 0.9  MICROALB/CREAT RATIO 0.0 - 30.0 mg/g 1.8    Latest Reference Range & Units 12/20/22 08:15  Glutamic Acid Decarb Ab <5 IU/mL 220 (H)     ASSESSMENT / PLAN / RECOMMENDATIONS:   1) Type 1 Diabetes Mellitus, Optimally controlled, Without complications - Most recent A1c of 7.2 %. Goal A1c < 7.0 %.    -A1c is skewed due to recent iron  infusion, GMI based on Dexcom is 7.9% -I have praised the patient on improved glycemic control - Patient with elevated GAD -65 -Intolerant to Victoza and Ozempic due to GI side effects -Patient with history of DKA in 2022, SGLT2 inhibitors are contraindicated - In the past we have entertained the idea of switching Rybelsus  to Mounjaro but she has had severe GI side effects to injectables and opted to remain on Rybelsus  - I have asked her to enter 6 g of carbohydrates with breakfast and 8 g with supper - I have also adjusted his sensitivity factor from 30 to 25 - MA/CR ratio normal   MEDICATIONS: Continue metformin  500 mg XR, 2 tabs daily Continue Rybelsus  14 mg daily Novolog        Pump   OmniPod Settings   Insulin  type   Novolog     Basal rate       0000  1.5 u/h               I:C ratio       0000 1:1    Enter # 6g with breakfast and 8G with supper              Sensitivity       0000  25      Goal       0000  110         EDUCATION / INSTRUCTIONS: BG monitoring instructions: Patient is instructed to check her blood sugars 3 times a day, before meals. Call [facility] if: BG persistently < 70  I reviewed  the Rule of 15 for the treatment of hypoglycemia in detail with the patient. Literature supplied.   2) Diabetic complications:  Eye: Does not have known diabetic retinopathy.  Neuro/ Feet: Does not have known diabetic peripheral neuropathy. Renal: Patient does not have known baseline CKD. She is  on an ACEI/ARB at present.  3)Hashimoto's Thyroiditis :  -Patient is clinically euthyroid - TSH is normal, no change   Medication Continue levothyroxine  150 mcg, 1.5 tabs on Sundays and 1 tablet rest of the week  Follow-up in 6 months   Signed electronically by: Stefano Redgie Butts, MD  [facility]  [facility] [address] [place], [place] [address] Phone: [phone] FAX: [phone]   CC: Lavonia Iha, FNP [address] [place] [address] Phone: [phone]  Fax: [phone]    Return to Endocrinology clinic as below: Future Appointments  Date Time Provider Department Center  05/18/2024  7:50 AM Rockie Schnoor, Donell Redgie, MD [id number] None

## 2023-11-19 LAB — T4, FREE: Free T4: 1.9 ng/dL — ABNORMAL HIGH (ref 0.8–1.8)

## 2023-11-19 LAB — MICROALBUMIN / CREATININE URINE RATIO
Creatinine, Urine: 128 mg/dL (ref 20–275)
Microalb Creat Ratio: 18 mg/g{creat} (ref <30)
Microalb, Ur: 2.3 mg/dL

## 2023-11-19 LAB — TSH: TSH: 2.17 m[IU]/L

## 2023-11-21 ENCOUNTER — Encounter: Payer: Self-pay | Admitting: Internal Medicine

## 2023-11-21 ENCOUNTER — Ambulatory Visit: Payer: Self-pay | Admitting: Internal Medicine

## 2023-12-01 ENCOUNTER — Encounter: Payer: Self-pay | Admitting: Hematology and Oncology

## 2024-01-27 LAB — HM DIABETES EYE EXAM

## 2024-02-03 ENCOUNTER — Encounter (HOSPITAL_BASED_OUTPATIENT_CLINIC_OR_DEPARTMENT_OTHER): Payer: Self-pay | Admitting: Emergency Medicine

## 2024-02-03 ENCOUNTER — Emergency Department (HOSPITAL_BASED_OUTPATIENT_CLINIC_OR_DEPARTMENT_OTHER)
Admission: EM | Admit: 2024-02-03 | Discharge: 2024-02-03 | Disposition: A | Discharge status: Home / Self Care | Source: Ambulatory Visit | Attending: Emergency Medicine | Admitting: Emergency Medicine

## 2024-02-03 ENCOUNTER — Other Ambulatory Visit: Payer: Self-pay

## 2024-02-03 DIAGNOSIS — E109 Type 1 diabetes mellitus without complications: Secondary | ICD-10-CM | POA: Diagnosis not present

## 2024-02-03 DIAGNOSIS — Z7984 Long term (current) use of oral hypoglycemic drugs: Secondary | ICD-10-CM | POA: Insufficient documentation

## 2024-02-03 DIAGNOSIS — Z794 Long term (current) use of insulin: Secondary | ICD-10-CM | POA: Insufficient documentation

## 2024-02-03 DIAGNOSIS — M545 Low back pain, unspecified: Secondary | ICD-10-CM | POA: Diagnosis present

## 2024-02-03 DIAGNOSIS — M5416 Radiculopathy, lumbar region: Secondary | ICD-10-CM | POA: Insufficient documentation

## 2024-02-03 HISTORY — DX: Spinal stenosis, site unspecified: M48.00

## 2024-02-03 HISTORY — DX: Autoimmune thyroiditis: E06.3

## 2024-02-03 MED ORDER — PREDNISONE 50 MG PO TABS
60.0000 mg | ORAL_TABLET | Freq: Once | ORAL | Status: AC
  Administered 2024-02-03: 60 mg via ORAL
  Filled 2024-02-03: qty 1

## 2024-02-03 MED ORDER — PREDNISONE 20 MG PO TABS: ORAL_TABLET | ORAL | 0 refills | Status: AC

## 2024-02-03 MED ORDER — OXYCODONE HCL 5 MG PO TABS: 5.0000 mg | ORAL_TABLET | Freq: Four times a day (QID) | ORAL | 0 refills | Status: AC | PRN

## 2024-02-03 NOTE — ED Notes (Signed)
 Discharge instructions reviewed with patient. Patient verbalizes understanding, no further questions at this time. Medications/prescriptions and follow up information provided. No acute distress noted at time of departure.

## 2024-02-03 NOTE — ED Triage Notes (Signed)
 Pt c/o back pain since Monday which turned into sciatica-type pain radiating down RLE; woke up with numbness to RT foot, numbness now up to RT knee; numbness began after using IcyHot

## 2024-02-03 NOTE — ED Provider Notes (Signed)
 [facility] Provider Note   CSN: [id number] Arrival date & time: 02/03/24  1212     Patient presents with: Numbness   Terri Mercer is a 33 y.o. female.   Patient with history of insulin -dependent diabetes, high cholesterol, spinal stenosis --to the emergency department for evaluation of paresthesias in right lower extremity, worsening.  Patient states that she has had sciatic type pain in the past.  She developed back pain about 4 days ago which then became more of a pain in the right leg and less in the lower back.  She saw PCP, was prescribed tizanidine, diclofenac on 01/30/2024.  She states that these have not really been helping.  She also has some muscle twitching in her right leg.  She developed paresthesias in the right foot that have now extended up to the calf area.  No leg swelling.  Patient denies warning symptoms of back pain including: fecal incontinence, urinary retention or overflow incontinence, night sweats, unexplained fevers or weight loss, h/o cancer, IVDU, recent trauma.  She has seen a spine specialist in the past, but not in the past few years.   03/2021 Lumbar MRI IMPRESSION: 1. Large central disc protrusion at L5-S1 with resultant moderate to severe canal and bilateral subarticular stenosis. Either of the descending S1 nerve roots could be affected. 2. Moderate sized right subarticular disc protrusion at L4-5, impinging upon the descending right L5 nerve root in the right lateral recess. 3. Moderate to large central to left subarticular disc protrusion at L3-4 with resultant moderate left worse than right lateral recess stenosis. The descending left L4 nerve root could be affected.        Prior to Admission medications   Medication Sig Start Date End Date Taking? Authorizing Provider  oxyCODONE (OXY IR/ROXICODONE) 5 MG immediate release tablet Take 1 tablet (5 mg total) by mouth every 6 (six) hours as  needed for severe pain (pain score 7-10). 02/03/24  Yes Taetum Flewellen, PA-C  predniSONE (DELTASONE) 20 MG tablet 3 Tabs PO Days 1-3, then 2 tabs PO Days 4-6, then 1 tab PO Day 7-9, then Half Tab PO Day 10-12 02/03/24  Yes Desiderio Chew, PA-C  Continuous Glucose Sensor (DEXCOM G7 SENSOR) MISC 1 Device by Does not apply route as directed. 04/27/23   Shamleffer, Ibtehal Jaralla, MD  EPINEPHrine 0.3 mg/0.3 mL IJ SOAJ injection SMARTSIG:0.3 Milliliter(s) IM 1-2 Times Daily 06/24/23   [provider]  famotidine (PEPCID) 40 MG tablet Take 40 mg by mouth daily.    [provider]  fenofibrate micronized (LOFIBRA) 67 MG capsule Take 134 mg by mouth daily. 10/26/22   [provider]  ferrous sulfate 325 (65 FE) MG tablet Take 325 mg by mouth 3 (three) times a week. 09/25/22   [provider]  insulin  aspart (NOVOLOG ) 100 UNIT/ML injection 90 units a day per pump 11/18/23   Shamleffer, Ibtehal Jaralla, MD  Insulin  Disposable Pump (OMNIPOD 5 DEXG7G6 INTRO GEN 5) KIT USE 1 DEVICE BY DOES NOT APPLY ROUTE EVERY OTHER DAY. 11/14/23   Shamleffer, Ibtehal Jaralla, MD  Insulin  Disposable Pump (OMNIPOD 5 G7 PODS, GEN 5,) MISC 1 Device by Does not apply route every other day. 08/17/23   Shamleffer, Ibtehal Jaralla, MD  levothyroxine  (SYNTHROID ) 150 MCG tablet TAKE 1 TABLET (150 MCG TOTAL) BY MOUTH AS DIRECTED. 1.5 TABS ON SUNDAYS AND 1 TAB REST OF THE WEEK 11/14/23   Shamleffer, Ibtehal Jaralla, MD  lisinopril (PRINIVIL,ZESTRIL) 2.5 MG  tablet Take 2.5 mg by mouth daily.    [provider]  meloxicam  (MOBIC ) 15 MG tablet Take 1/2 to 1 tablet at bedtime as needed for leg pain. 07/02/15   Molpus, Norleen, MD  metFORMIN  (GLUCOPHAGE -XR) 500 MG 24 hr tablet Take 2 tablets (1,000 mg total) by mouth daily with breakfast. 11/18/23   Shamleffer, Ibtehal Jaralla, MD  rosuvastatin (CRESTOR) 10 MG tablet Take 10 mg by mouth at bedtime. 08/07/22   [provider]  Semaglutide  (RYBELSUS ) 14 MG TABS  Take 1 tablet (14 mg total) by mouth every morning. 11/18/23   Shamleffer, Ibtehal Jaralla, MD  tiZANidine (ZANAFLEX) 4 MG tablet Take 4 mg by mouth every 6 (six) hours as needed. 08/13/22   [provider]    Allergies: Bee venom, Jardiance [empagliflozin], Lactose intolerance (gi), and Ozempic (0.25 or 0.5 mg-dose) [semaglutide (0.25 or 0.5mg -dos)]    Review of Systems  Updated Vital Signs BP (!) 157/93 (BP Location: Right Arm)   Pulse 88   Temp 97.8 F (36.6 C) (Oral)   Resp 18   Ht 5' 6 (1.676 m)   Wt 97.5 kg   SpO2 96%   BMI 34.70 kg/m   Physical Exam Vitals and nursing note reviewed.  Constitutional:      Appearance: She is well-developed.  HENT:     Head: Normocephalic and atraumatic.  Eyes:     Conjunctiva/sclera: Conjunctivae normal.  Pulmonary:     Effort: Pulmonary effort is normal.  Abdominal:     Palpations: Abdomen is soft.     Tenderness: There is no abdominal tenderness.  Musculoskeletal:        General: Normal range of motion.     Cervical back: Normal range of motion and neck supple.     Comments: No step-off noted with palpation of spine.   Skin:    General: Skin is warm and dry.     Findings: No rash.  Neurological:     Mental Status: She is alert.     Sensory: Sensory deficit present.     Gait: Gait normal.     Deep Tendon Reflexes: Reflexes are normal and symmetric.     Comments: Patient with trace weakness in dorsiflexion of the right foot.  No hyperreflexia of the patellar reflexes bilaterally, symmetric reflexes.   Patient has sensation in the right calf, ankle and foot, however reports decreased sensation, pins-and-needles.  Patient able to sit at bedside, ambulate in room without assistance.  No signs of foot drop with ambulation.     (all labs ordered are listed, but only abnormal results are displayed) Labs Reviewed - No data to display  EKG: None  Radiology: No results found.   Procedures   Medications Ordered in the ED  - No data to display   ED Course  Patient seen and examined. History obtained directly from patient.  Reviewed previous MRI results.  Labs/EKG: None ordered.  Imaging: None ordered. Considering imaging of the lumbar spine with x-ray or CT but no red flags today and feel this would be low yield.   Medications/Fluids: Ordered: Oral prednisone  Most recent vital signs reviewed and are as follows: BP (!) 157/93 (BP Location: Right Arm)   Pulse 88   Temp 97.8 F (36.6 C) (Oral)   Resp 18   Ht 5' 6 (1.676 m)   Wt 97.5 kg   SpO2 96%   BMI 34.70 kg/m   Initial impression: Low back pain with radicular features, progressive.  I had  a long discussion with patient at bedside in regards to treatment.  She has not had much benefit with tizanidine and with diclofenac.  We discussed trial of prednisone in setting of diabetes.  Patient has Dexcom, sliding scale meal coverage and can make adjustments if blood sugar goes up.  We decided that her symptoms likely warrant trial of steroids with close monitoring of blood sugar.  She is advised to discontinue if sugar goes above 400.  Home treatment plan: Patient was counseled on back pain precautions and advised to do activity as tolerated but avoid strenuous activity and do not lift, push, or pull heavy objects more than 10 pounds for the next week.  Patient counseled to use ice or heat on back as needed for pain and spasm.    Medications prescribed: Prednisone taper, # 8 tablets oxycodone 5 mg.  Patient counseled on use of narcotic pain medications. Counseled not to combine these medications with others containing tylenol . Urged not to drink alcohol, drive, or perform any other activities that requires focus while taking these medications. The patient verbalizes understanding and agrees with the plan.  Return instructions discussed with patient: Urged to return with worsening severe pain, loss of bowel or bladder control, trouble walking, development  of weakness in the legs, or with any other concerns.  Follow-up instructions discussed with patient: Patient urged to follow-up with PCP if pain does not improve with treatment and rest or if pain becomes recurrent.                                   Medical Decision Making  Patient with back pain.  She has developed paresthesias in her right leg, no full numbness, trace weakness with dorsiflexion of the right foot.  Patient is ambulatory.  Otherwise no warning symptoms of back pain including: fecal incontinence, urinary retention or overflow incontinence, night sweats, unexplained fevers or weight loss, h/o cancer, IVDU, recent trauma. No concern for cauda equina, epidural abscess, or other serious cause of back pain at this time.  She will benefit from outpatient neurosurgery follow-up.  Conservative measures such as rest, ice/heat and pain medicine indicated with follow-up.  She seems reliable to return if symptoms worsen.  No indications for transfer for emergent MRI at this time or for emergent neurosurgical consultation.  We discussed risks and benefits of prednisone use in setting of insulin -dependent diabetes.  Patient is able to monitor her blood sugars in real-time and make adjustments with her insulin , and I feel that it is reasonable to try this given progressive nature of her current symptoms, not improved with NSAIDs and muscle relaxers in the interim.      Final diagnoses:  Lumbar radiculopathy    ED Discharge Orders          Ordered    predniSONE (DELTASONE) 20 MG tablet        02/03/24 1313    oxyCODONE (OXY IR/ROXICODONE) 5 MG immediate release tablet  Every 6 hours PRN        02/03/24 1313               Desiderio Chew, PA-C 02/03/24 1336    Patsey Lot, MD 02/03/24 1441

## 2024-02-03 NOTE — Discharge Instructions (Signed)
 Please read and follow all provided instructions.  Your diagnoses today include:  1. Lumbar radiculopathy     Tests performed today include: Vital signs - see below for your results today  Medications prescribed:  Prednisone - steroid medicine   It is best to take this medication in the morning to prevent sleeping problems. If you are diabetic, monitor your blood sugar closely and stop taking Prednisone if blood sugar is over 300. Take with food to prevent stomach upset.   Oxycodone - narcotic pain medication  DO NOT drive or perform any activities that require you to be awake and alert because this medicine can make you drowsy.   Take any prescribed medications only as directed.  Home care instructions:  Follow any educational materials contained in this packet You may continue to use the medications prescribed by your primary care (diclofenac, tizanidine) Please rest, use ice or heat on your back for the next several days Do not lift, push, pull anything more than 10 pounds for the next week  Follow-up instructions: Please follow-up with the neurosurgery referral in the next 1 week for further evaluation of your symptoms.   Return instructions:  SEEK IMMEDIATE MEDICAL ATTENTION IF YOU HAVE: Worsening numbness, tingling, weakness, or problem with the use of your arms or legs Severe back pain not relieved with medications Loss control of your bowels or bladder Increasing pain in any areas of the body (such as chest or abdominal pain) Shortness of breath, dizziness, or fainting.  Worsening nausea (feeling sick to your stomach), vomiting, fever, or sweats Any other emergent concerns regarding your health   Additional Information:  Your vital signs today were: BP (!) 157/93 (BP Location: Right Arm)   Pulse 88   Temp 97.8 F (36.6 C) (Oral)   Resp 18   Ht 5' 6 (1.676 m)   Wt 97.5 kg   SpO2 96%   BMI 34.70 kg/m  If your blood pressure (BP) was elevated above 135/85  this visit, please have this repeated by your doctor within one month. --------------

## 2024-03-29 ENCOUNTER — Other Ambulatory Visit: Payer: Self-pay | Admitting: Internal Medicine

## 2024-04-11 ENCOUNTER — Encounter: Payer: Self-pay | Admitting: Internal Medicine

## 2024-04-13 ENCOUNTER — Telehealth: Payer: Self-pay

## 2024-04-13 ENCOUNTER — Other Ambulatory Visit (HOSPITAL_COMMUNITY): Payer: Self-pay

## 2024-04-13 NOTE — Telephone Encounter (Signed)
 Pharmacy Patient Advocate Encounter   Received notification from Pt Calls Messages that prior authorization for Dexcom G7 sensor is required/requested.   Insurance verification completed.   The patient is insured through [facility].   Per test claim: PA required; PA submitted to above mentioned insurance via Latent Key/confirmation #/EOC [id number] Status is pending

## 2024-04-18 ENCOUNTER — Other Ambulatory Visit (HOSPITAL_COMMUNITY): Payer: Self-pay

## 2024-04-18 NOTE — Telephone Encounter (Signed)
 Pharmacy Patient Advocate Encounter  Received notification from [facility] that Prior Authorization for Dexcom G7 sensor  has been APPROVED from 04/13/24 to 04/13/25. Ran test claim, Copay is $55.00 (30 day supply). This test claim was processed through [facility]- copay amounts may vary at other pharmacies due to pharmacy/plan contracts, or as the patient moves through the different stages of their insurance plan.   PA #/Case ID/Reference #: [id number]

## 2024-05-18 ENCOUNTER — Encounter: Payer: Self-pay | Admitting: Internal Medicine

## 2024-05-18 ENCOUNTER — Telehealth: Admitting: Internal Medicine

## 2024-05-18 VITALS — Ht 66.0 in | Wt 210.0 lb

## 2024-05-18 DIAGNOSIS — E063 Autoimmune thyroiditis: Secondary | ICD-10-CM

## 2024-05-18 DIAGNOSIS — E1065 Type 1 diabetes mellitus with hyperglycemia: Secondary | ICD-10-CM

## 2024-05-18 MED ORDER — METFORMIN HCL ER 500 MG PO TB24: 1000.0000 mg | ORAL_TABLET | Freq: Every day | ORAL | 3 refills | Status: AC

## 2024-05-18 MED ORDER — DEXCOM G7 SENSOR MISC: 1.0000 | 3 refills | Status: AC

## 2024-05-18 MED ORDER — INSULIN ASPART 100 UNIT/ML IJ SOLN: INTRAMUSCULAR | 3 refills | Status: AC

## 2024-05-18 MED ORDER — RYBELSUS 14 MG PO TABS: 1.0000 | ORAL_TABLET | Freq: Every morning | ORAL | 3 refills | Status: AC

## 2024-05-18 MED ORDER — OMNIPOD 5 G7 PODS (GEN 5) MISC: 1.0000 | 3 refills | Status: AC

## 2024-05-18 NOTE — Progress Notes (Signed)
 Virtual Visit via Video Note  I connected with Koleen JONETTA Dover  on 05/18/24 at  7:50 AM EST by a video enabled telemedicine application and verified that I am speaking with the correct person using two identifiers.   I discussed the limitations of evaluation and management by telemedicine and the availability of in person appointments. The patient expressed understanding and agreed to proceed.   -Location of the patient : Home  -Location of the provider : Office -The names of all persons participating in the telemedicine service : Pt and myself    Name: JIA DOTTAVIO  MRN/ DOB: [id number], [birth year]   Age/ Sex: 34 y.o., female    PCP: Lavonia Iha, FNP   Reason for Endocrinology Evaluation: Type 1 Diabetes Mellitus/Hashimoto's thyroiditis     Date of Initial Endocrinology Visit: 12/20/2022    PATIENT IDENTIFIER: Ms. CARAMIA BOUTIN is a 34 y.o. female with a past medical history of DM, Hashimoto's Thyroiditis. The patient presented for initial endocrinology clinic visit on 12/20/2022 for consultative assistance with her diabetes management.    HPI: Ms. Cumbo was    Diagnosed with DM 2006 at age 34 Prior Medications tried/Intolerance: intolerant to Metformin  - persistent hyperglycemia, victoza- nausea/vomiting  , glimepiride . Started on insulin  prior to 2022     Hemoglobin A1c has ranged from 8.3% in 01/2022, peaking at 12.5% in 10/2021. Pt with hx of DKA 2022   On her initial visit to our clinic she had an A1c 7.6%, she was on Synjardy , Rybelsus , Tresiba .  We adjusted Tresiba    GAD-65 elevated at 220 we will change her diagnosis to T1 DM 12/2022  We discontinued Jardiance and just started her on plain metformin  at the time  THYROID  HISTORY She has been noted with elevated TPO Ab in 2023. She was diagnosed with hypothyroidism at age 60 in 75  Mother with Hashimoto's thyroiditis  Based on patient report of the presence of thyroid  nodules, thyroid  ultrasound  was performed 12/2022 that did NOT reveal any nodules but rather mildly heterogeneous and enlarged thyroid  gland consistent with clinical history of Hashimoto's thyroiditis with no discrete nodules or significant hypervascularity.   She was trained on the OmniPod use by our CDE May, 2025   SUBJECTIVE:   During the last visit (11/18/2023): A1c 7.2%    Today (05/18/24): DESARAY MARSCHNER is here for follow-up on diabetes management   She checks her blood sugars multiple times daily through Cgm. She has been noted with hypoglycemia, she is symptomatic.   Her levothyroxine  has been changed to her PCP in December to 175 mcg, she is scheduled to have labs later this afternoon  She has been following up with a spine specialist, she did receive intra-articular spine injection, which has caused hyperglycemia, she continues with lower extremity numbness Has occasional constipation Has occasional palpitations specially in the morning She did have sinus issues which resulted in forgetting to wear her CPAP at times She had an transient episode of nausea and vomiting a few weeks ago   This patient with type 2 diabetes is treated with OmniPod (insulin  pump). During the visit the pump basal and bolus doses were reviewed including carb/insulin  rations and supplemental doses. The clinical list was updated. The glucose meter download was reviewed in detail to determine if the current pump settings are providing the best glycemic control without excessive hypoglycemia.  Pump and CGM download:    Pump   OmniPod Settings   Insulin  type   Novolog   Basal rate       0000 1.5 u/h               I:C ratio       0000 1:1    Enter # 6g with breakfast and 8 g with supper              Sensitivity       0000  25      Goal       0000  110            Type & Model of Pump: OmniPod Insulin  Type: Currently using Novolog .  There is no height or weight on file to calculate BMI.  PUMP  STATISTICS:                       HOME ENDOCRINE REGIMEN: Metformin  500 mg XR, 2 tablet daily  Rybelsus  14 mg daily  Novolog        CONTINUOUS GLUCOSE MONITORING RECORD INTERPRETATION      Glycemic patterns summary: BGs are optimal overnight and between the meals  Hyperglycemic episodes postprandial  Hypoglycemic episodes occurred at night  Overnight periods: Mostly optimal    Statin: yes ACE-I/ARB:yes Prior Diabetic Education: yes    DIABETIC COMPLICATIONS: Microvascular complications:   Denies: retinopathy, CKD , neuropathy  Last eye exam: Completed 01/17/2024  Macrovascular complications:   Denies: CAD, PVD, CVA   PAST HISTORY: Past Medical History:  Past Medical History:  Diagnosis Date   Diabetes mellitus without complication (HCC)    Hashimoto's disease    Spinal stenosis    Past Surgical History: No past surgical history on file.  Social History:  reports that she has never smoked. She does not have any smokeless tobacco history on file. She reports that she does not drink alcohol and does not use drugs. Family History: No family history on file.   HOME MEDICATIONS: Allergies as of 05/18/2024       Reactions   Bee Venom Swelling   Localized swelling   Jardiance [empagliflozin]    Lactose Intolerance (gi) Diarrhea   Ozempic (0.25 Or 0.5 Mg-dose) [semaglutide (0.25 Or 0.5mg -dos)]         Medication List        Accurate as of May 18, 2024  7:04 AM. If you have any questions, ask your nurse or doctor.          Dexcom G7 Sensor Misc USE 1 DEVICE BY DOES NOT APPLY ROUTE AS DIRECTED.   EPINEPHrine 0.3 mg/0.3 mL Soaj injection Commonly known as: EPI-PEN SMARTSIG:0.3 Milliliter(s) IM 1-2 Times Daily   famotidine 40 MG tablet Commonly known as: PEPCID Take 40 mg by mouth daily.   fenofibrate micronized 67 MG capsule Commonly known as: LOFIBRA Take 134 mg by mouth daily.   ferrous sulfate 325 (65 FE) MG  tablet Take 325 mg by mouth 3 (three) times a week.   insulin  aspart 100 UNIT/ML injection Commonly known as: novoLOG  90 units a day per pump   levothyroxine  150 MCG tablet Commonly known as: SYNTHROID  TAKE 1 TABLET (150 MCG TOTAL) BY MOUTH AS DIRECTED. 1.5 TABS ON SUNDAYS AND 1 TAB REST OF THE WEEK   lisinopril 2.5 MG tablet Commonly known as: ZESTRIL Take 2.5 mg by mouth daily.   meloxicam  15 MG tablet Commonly known as: Mobic  Take 1/2 to 1 tablet at bedtime as needed for leg pain.   metFORMIN  500 MG 24 hr tablet Commonly known as: GLUCOPHAGE -XR  Take 2 tablets (1,000 mg total) by mouth daily with breakfast.   Omnipod 5 G7 Pods (Gen 5) Misc 1 Device by Does not apply route every other day.   Omnipod 5 DexG7G6 Intro Gen 5 Kit USE 1 DEVICE BY DOES NOT APPLY ROUTE EVERY OTHER DAY.   oxyCODONE  5 MG immediate release tablet Commonly known as: Oxy IR/ROXICODONE  Take 1 tablet (5 mg total) by mouth every 6 (six) hours as needed for severe pain (pain score 7-10).   predniSONE  20 MG tablet Commonly known as: DELTASONE  3 Tabs PO Days 1-3, then 2 tabs PO Days 4-6, then 1 tab PO Day 7-9, then Half Tab PO Day 10-12   rosuvastatin 10 MG tablet Commonly known as: CRESTOR Take 10 mg by mouth at bedtime.   Rybelsus  14 MG Tabs Generic drug: Semaglutide  Take 1 tablet (14 mg total) by mouth every morning.   tiZANidine 4 MG tablet Commonly known as: ZANAFLEX Take 4 mg by mouth every 6 (six) hours as needed.         ALLERGIES: Allergies  Allergen Reactions   Bee Venom Swelling    Localized swelling   Jardiance [Empagliflozin]    Lactose Intolerance (Gi) Diarrhea   Ozempic (0.25 Or 0.5 Mg-Dose) [Semaglutide (0.25 Or 0.5mg -Dos)]      REVIEW OF SYSTEMS: A comprehensive ROS was conducted with the patient and is negative except as per HPI     OBJECTIVE:   VITAL SIGNS: There were no vitals taken for this visit.   PHYSICAL EXAM:  General: Pt appears well and is in NAD   Neuro: MS is good with appropriate affect, pt is alert and Ox3    DM foot exam: 08/17/2023  The skin of the feet is intact without sores or ulcerations. The pedal pulses are 2+ on right and 2+ on left. The sensation is intact to a screening 5.07, 10 gram monofilament bilaterally   DATA REVIEWED:  Lab Results  Component Value Date   HGBA1C 7.2 (A) 11/18/2023   HGBA1C 7.6 (A) 12/20/2022    Latest Reference Range & Units 11/18/23 08:39  TSH mIU/L 2.17  T4,Free(Direct) 0.8 - 1.8 ng/dL 1.9 (H)    Latest Reference Range & Units 11/18/23 08:50  Microalb, Ur mg/dL 2.3  MICROALB/CREAT RATIO <30 mg/g creat 18  Creatinine, Urine 20 - 275 mg/dL 871     6/87/7974  HDL 45 LDL 97 Tg 185 BUN 11 Cr. 0.73 GFR 111 A1c 9.5   21/13/2025 2.89 uIU/mL      Latest Reference Range & Units 12/20/22 08:15  Creatinine,U mg/dL 52.0  Microalb, Ur 0.0 - 1.9 mg/dL 0.9  MICROALB/CREAT RATIO 0.0 - 30.0 mg/g 1.8    Latest Reference Range & Units 12/20/22 08:15  Glutamic Acid Decarb Ab <5 IU/mL 220 (H)     ASSESSMENT / PLAN / RECOMMENDATIONS:   1) Type 1 Diabetes Mellitus, Optimally controlled, Without complications - . Goal A1c < 7.0 %.    -This was a virtual visit -GMI 7.3% on CGM download - Patient with elevated GAD -65 -Intolerant to Victoza and Ozempic due to GI side effects -Patient with history of DKA in 2022, SGLT2 inhibitors are contraindicated - In the past we have entertained the idea of switching Rybelsus  to Mounjaro but she has had severe GI side effects to injectables and opted to remain on Rybelsus  -Due to hypoglycemia overnight and tight BG's during the day when she is not eating, I have decreased her basal rate as below -The patient will  need more carbohydrate entry with each meal - I have asked her to enter 8 g of carbohydrates with breakfast and 10 g with supper    MEDICATIONS: Continue metformin  500 mg XR, 2 tabs daily Continue Rybelsus  14 mg daily Novolog         Pump   OmniPod Settings   Insulin  type   Novolog     Basal rate       0000 1.3 u/h               I:C ratio       0000 1:1    Enter # 8g with breakfast and 10g with supper              Sensitivity       0000  25      Goal       0000  110         EDUCATION / INSTRUCTIONS: BG monitoring instructions: Patient is instructed to check her blood sugars 3 times a day, before meals. Call [facility] if: BG persistently < 70  I reviewed the Rule of 15 for the treatment of hypoglycemia in detail with the patient. Literature supplied.   2) Diabetic complications:  Eye: Does not have known diabetic retinopathy.  Neuro/ Feet: Does not have known diabetic peripheral neuropathy. Renal: Patient does not have known baseline CKD. She is  on an ACEI/ARB at present.  3)Hashimoto's Thyroiditis :  - Patient is clinically euthyroid -She had her levothyroxine  increased to her PCPs office in December, 2025, she is scheduled to have her labs repeated today -Will defer further levothyroxine  management to her PCP, we will be happy to make the appropriate adjustments if needed in the future    Follow-up in 6 months   Signed electronically by: Stefano Redgie Butts, MD  [facility] Endocrinology  [facility] [address] [place] [place], [place] [address] Phone: [phone] FAX: [phone]   CC: Lavonia Iha, FNP [address] [place] [address] Phone: [phone]  Fax: [phone]    Return to Endocrinology clinic as below: Future Appointments  Date Time Provider Department Center  05/18/2024  7:50 AM Tarick Parenteau, Donell Redgie, MD [facility] None

## 2024-11-12 ENCOUNTER — Ambulatory Visit: Admitting: Internal Medicine
